# Patient Record
Sex: Male | Born: 1940 | Race: White | Hispanic: No | Marital: Married | Smoking: Former smoker
Health system: Southern US, Community
[De-identification: ages and names within clinical notes are randomized; demographics above are authoritative.]

## PROBLEM LIST (undated history)

## (undated) DIAGNOSIS — E876 Hypokalemia: Secondary | ICD-10-CM

## (undated) DIAGNOSIS — I10 Essential (primary) hypertension: Secondary | ICD-10-CM

## (undated) DIAGNOSIS — E785 Hyperlipidemia, unspecified: Secondary | ICD-10-CM

## (undated) DIAGNOSIS — F329 Major depressive disorder, single episode, unspecified: Secondary | ICD-10-CM

## (undated) HISTORY — DX: Essential (primary) hypertension: I10

## (undated) HISTORY — DX: Hypokalemia: E87.6

## (undated) HISTORY — DX: Major depressive disorder, single episode, unspecified: F32.9

## (undated) HISTORY — DX: Hyperlipidemia, unspecified: E78.5

---

## 2018-07-30 ENCOUNTER — Ambulatory Visit (INDEPENDENT_AMBULATORY_CARE_PROVIDER_SITE_OTHER): Payer: Self-pay

## 2018-07-30 ENCOUNTER — Ambulatory Visit (INDEPENDENT_AMBULATORY_CARE_PROVIDER_SITE_OTHER): Payer: Medicare Other | Admitting: Orthopaedic Surgery

## 2018-07-30 DIAGNOSIS — M65331 Trigger finger, right middle finger: Secondary | ICD-10-CM

## 2018-07-30 MED ORDER — BUPIVACAINE HCL 0.5 % IJ SOLN
0.3300 mL | INTRAMUSCULAR | Status: AC | PRN
  Administered 2018-07-30: .33 mL

## 2018-07-30 MED ORDER — LIDOCAINE HCL 1 % IJ SOLN
0.3000 mL | INTRAMUSCULAR | Status: AC | PRN
  Administered 2018-07-30: .3 mL

## 2018-07-30 MED ORDER — METHYLPREDNISOLONE ACETATE 40 MG/ML IJ SUSP
13.3300 mg | INTRAMUSCULAR | Status: AC | PRN
  Administered 2018-07-30: 13.33 mg

## 2018-07-30 NOTE — Progress Notes (Signed)
Office Visit Note   Patient: Gregory Perez           Date of Birth: 1941-02-05           MRN: [id number] Visit Date: 07/30/2018              Requested by: No referring provider defined for this encounter. PCP: Kerrie Pleasure, MD   Assessment & Plan: Visit Diagnoses:  1. Trigger middle finger of right hand     Plan: Impression is right middle trigger finger.  Injection performed today into the A1 pulley.  Patient had good anesthetic relief from this injection.  He is to take it easy with his right hand if possible.  Questions encouraged and answered.  Follow-up as needed.  Follow-Up Instructions: Return if symptoms worsen or fail to improve.   Orders:  No orders of the defined types were placed in this encounter.  No orders of the defined types were placed in this encounter.     Procedures: Hand/UE Inj: R long A1 for trigger finger on 07/30/2018 10:09 AM Indications: pain Details: 25 G needle Medications: 0.3 mL lidocaine 1 %; 0.33 mL bupivacaine 0.5 %; 13.33 mg methylPREDNISolone acetate 40 MG/ML Outcome: tolerated well, no immediate complications Consent was given by the patient. Patient was prepped and draped in the usual sterile fashion.       Clinical Data: No additional findings.   Subjective: Chief Complaint  Patient presents with  . Right Hand - Pain  . Right Wrist - Pain    Gregory Perez is a 78 year old gentleman who comes in for complaint of 3 months of right middle finger pain near the A1 pulley.  He states that this will lock up frequently.  He denies any injuries.  Denies any numbness and tingling.  Does feel stiff at times.  Denies diabetes.  He is right-hand dominant.   Review of Systems  Constitutional: Negative.   All other systems reviewed and are negative.    Objective: Vital Signs: There were no vitals taken for this visit.  Physical Exam Vitals signs and nursing note reviewed.  Constitutional:      Appearance: He is well-developed.    HENT:     Head: Normocephalic and atraumatic.  Eyes:     Pupils: Pupils are equal, round, and reactive to light.  Neck:     Musculoskeletal: Neck supple.  Pulmonary:     Effort: Pulmonary effort is normal.  Abdominal:     Palpations: Abdomen is soft.  Musculoskeletal: Normal range of motion.  Skin:    General: Skin is warm.  Neurological:     Mental Status: He is alert and oriented to person, place, and time.  Psychiatric:        Behavior: Behavior normal.        Thought Content: Thought content normal.        Judgment: Judgment normal.     Ortho Exam Right hand exam shows a tender A1 pulley in line with the middle finger.  There is mild triggering with moderate pain.  Otherwise hand exam is unremarkable. Specialty Comments:  No specialty comments available.  Imaging: No results found.   PMFS History: There are no active problems to display for this patient.  No past medical history on file.  No family history on file.   Social History   Occupational History  . Not on file  Tobacco Use  . Smoking status: Not on file  Substance and Sexual Activity  . Alcohol  use: Not on file  . Drug use: Not on file  . Sexual activity: Not on file

## 2019-01-05 ENCOUNTER — Encounter: Payer: Self-pay | Admitting: Orthopaedic Surgery

## 2019-01-05 ENCOUNTER — Ambulatory Visit (INDEPENDENT_AMBULATORY_CARE_PROVIDER_SITE_OTHER): Payer: Medicare Other

## 2019-01-05 ENCOUNTER — Other Ambulatory Visit: Payer: Self-pay

## 2019-01-05 ENCOUNTER — Ambulatory Visit (INDEPENDENT_AMBULATORY_CARE_PROVIDER_SITE_OTHER): Payer: Medicare Other | Admitting: Orthopaedic Surgery

## 2019-01-05 ENCOUNTER — Telehealth: Payer: Self-pay | Admitting: Physician Assistant

## 2019-01-05 DIAGNOSIS — M25512 Pain in left shoulder: Secondary | ICD-10-CM

## 2019-01-05 DIAGNOSIS — M542 Cervicalgia: Secondary | ICD-10-CM

## 2019-01-05 MED ORDER — METHYLPREDNISOLONE 4 MG PO TBPK: ORAL_TABLET | ORAL | 0 refills | Status: DC

## 2019-01-05 MED ORDER — METHOCARBAMOL 500 MG PO TABS: 500.0000 mg | ORAL_TABLET | Freq: Every evening | ORAL | 0 refills | Status: DC | PRN

## 2019-01-05 NOTE — Telephone Encounter (Signed)
Patient aware of the below message  

## 2019-01-05 NOTE — Progress Notes (Signed)
Office Visit Note   Patient: Gregory Perez           Date of Birth: Nov 01, 1940           MRN: [id number] Visit Date: 01/05/2019              Requested by: Henrietta Dine, MD [address], [place],  [place] [address] PCP: Henrietta Dine, MD   Assessment & Plan: Visit Diagnoses:  1. Cervicalgia   2. Left shoulder pain, unspecified chronicity     Plan: Impression is left-sided cervical spine radiculopathy.  I will start the patient on a methylprednisolone taper and muscle relaxer nightly as needed.  I will also start him in formal physical therapy.  He splits his time in [place] and [place] but will follow-up with [place] as needed.  We did discuss proceeding with MRI in the future should his pain not improve.  Follow-Up Instructions: Return if symptoms worsen or fail to improve.   Orders:  Orders Placed This Encounter  Procedures   XR Shoulder Left   XR Cervical Spine 2 or 3 views   Meds ordered this encounter  Medications   methylPREDNISolone (MEDROL DOSEPAK) 4 MG TBPK tablet    Sig: Take as directed    Dispense:  21 tablet    Refill:  0   methocarbamol (ROBAXIN) 500 MG tablet    Sig: Take 1 tablet (500 mg total) by mouth at bedtime as needed for muscle spasms.    Dispense:  15 tablet    Refill:  0      Procedures: No procedures performed   Clinical Data: No additional findings.   Subjective: Chief Complaint  Patient presents with   Left Shoulder - Pain    HPI patient is a pleasant 78 year old gentleman who presents our clinic today with left parascapular pain.  This has been ongoing for the past several months.  The pain he has is primarily to the parascapular region and occasionally radiates into the shoulder itself.  There are no specific aggravators to the onset of this pain.  He does note slight weakness to the left upper extremity.  He has been taking over-the-counter NSAIDs with mild relief of symptoms.  He does note occasional tingling to the small finger.   He does note that he has a massage therapist that comes to his house door from his shoulder, but has not been for the past several months due to the coated pandemic.  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  No previous MRI, ESI or surgical intervention.  Review of Systems as detailed in HPI.  All others reviewed and are negative.   Objective: Vital Signs: There were no vitals taken for this visit.  Physical Exam well-developed and well-nourished gentleman in no acute distress.  Alert oriented x3.  Ortho Exam examination of the cervical spine reveals no spinous or paraspinous tenderness.  He has moderate and diffuse tenderness to the left parascapular region.  Normal exam of the left shoulder.  No focal weakness.  He is neurovascular intact distally.  Specialty Comments:  No specialty comments available.  Imaging: Xr Cervical Spine 2 Or 3 Views  Result Date: 01/05/2019 Significant degenerative disc disease from C4-C7 with periarticular spurring  Xr Shoulder Left  Result Date: 01/05/2019 No acute or structural abnormalities    PMFS History: There are no active problems to display for this patient.  History reviewed. No  pertinent past medical history.  History reviewed. No pertinent family history.  History reviewed. No pertinent surgical history. Social History   Occupational History   Not on file  Tobacco Use   Smoking status: Not on file  Substance and Sexual Activity   Alcohol use: Not on file   Drug use: Not on file   Sexual activity: Not on file

## 2019-01-05 NOTE — Telephone Encounter (Signed)
See message.

## 2019-01-05 NOTE — Telephone Encounter (Signed)
Did you give him just a generic Rx for PT?

## 2019-01-05 NOTE — Telephone Encounter (Signed)
Yes, he goes back and forth between  [place] and [place], and was thinking he would find a place in [place].  I just looke up locations, and it looks like [facility] and [facility] have good reviews.  He can go anywhere he wishes

## 2019-01-05 NOTE — Telephone Encounter (Signed)
This patient came in this morning and was given a script for PT and wasn't sure where he would be going.  He wants someone to call him back to recommend a facility for PT  [phone]

## 2019-04-08 ENCOUNTER — Other Ambulatory Visit: Payer: Self-pay

## 2019-04-08 ENCOUNTER — Ambulatory Visit (INDEPENDENT_AMBULATORY_CARE_PROVIDER_SITE_OTHER): Payer: Medicare Other | Admitting: Orthopaedic Surgery

## 2019-04-08 DIAGNOSIS — M542 Cervicalgia: Secondary | ICD-10-CM

## 2019-04-08 DIAGNOSIS — M65331 Trigger finger, right middle finger: Secondary | ICD-10-CM | POA: Diagnosis not present

## 2019-04-08 DIAGNOSIS — M4807 Spinal stenosis, lumbosacral region: Secondary | ICD-10-CM

## 2019-04-08 MED ORDER — LIDOCAINE HCL 1 % IJ SOLN
0.5000 mL | INTRAMUSCULAR | Status: AC | PRN
  Administered 2019-04-08: .5 mL

## 2019-04-08 MED ORDER — METHYLPREDNISOLONE ACETATE 40 MG/ML IJ SUSP
20.0000 mg | INTRAMUSCULAR | Status: AC | PRN
  Administered 2019-04-08: 20 mg

## 2019-04-08 MED ORDER — DIAZEPAM 5 MG PO TABS: 5.0000 mg | ORAL_TABLET | Freq: Once | ORAL | 0 refills | Status: DC | PRN

## 2019-04-08 NOTE — Progress Notes (Signed)
Office Visit Note   Patient: Gregory Perez           Date of Birth: 02-May-1941           MRN: [id number] Visit Date: 04/08/2019              Requested by: Henrietta Dine, MD [address], [place],  [place] [address] PCP: Henrietta Dine, MD   Assessment & Plan: Visit Diagnoses:  1. Trigger middle finger of right hand   2. Cervicalgia     Plan: I did place a steroid injection in the right hand at the A1 pulley of the middle finger.  He tolerated this well.  He will try Voltaren gel for this as well and I described how to use this.  At this point an MRI of the cervical spine is warranted to assess the degree of stenosis given his continued radicular symptoms involving the left upper extremity as well as weakness combined with pain and numbness and tingling.  I will send in some Valium for the MRI due to claustrophobia.  We will see him back once the MRI is obtained.  All question concerns were answered and addressed.  Follow-Up Instructions: He will call for follow-up once an MRI is obtained of the cervical spine.  Orders:  No orders of the defined types were placed in this encounter.  No orders of the defined types were placed in this encounter.     Procedures: Hand/UE Inj: R long A1 for trigger finger on 04/08/2019 3:46 PM Medications: 0.5 mL lidocaine 1 %; 20 mg methylPREDNISolone acetate 40 MG/ML      Clinical Data: No additional findings.   Subjective: Chief Complaint  Patient presents with   Right Wrist - Pain   Right Hand - Pain   Neck - Pain   Left Shoulder - Pain  The patient comes in today with continued radicular symptoms into his left shoulder and left scapular area going down his left arm.  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  This was confirmed with x-rays earlier this year that showed significant disc disease at the mid cervical spine from C5-C7.  He is also having active triggering of his right hand middle finger  and has had an injection there before.  That did help for some time but he like to try another injection because of triggering again.  He says his neck hurts quite a bit.  He is very active 78 years old.  He is reporting some right upper extremity weakness and parascapular pain.  HPI  Review of Systems He currently denies any headache, chest pain, shortness of breath, fever, chills, nausea, vomiting  Objective: Vital Signs: There were no vitals taken for this visit.  Physical Exam He is alert and orient x3 and in no acute distress Ortho Exam Examination of his cervical spine shows significant stiffness with the limited motion.  There is a positive Spurling sign to the left side.  There is parascapular pain and some triceps weakness.  There is subjective numbness in C6 distribution as well on the left side.  His right side is normal.  Examination of his right hand does show pain over the A1 pulley of the middle finger with active triggering. Specialty Comments:  No specialty comments available.  Imaging: No results found. We did review the cervical spine films from earlier this year showing the degree of significant and severe mid cervical arthritis with the space narrowing  in particular osteophytes as well as a loss of the cervical lordosis.  PMFS History: There are no active problems to display for this patient.  No past medical history on file.  No family history on file.  No past surgical history on file. Social History   Occupational History   Not on file  Tobacco Use   Smoking status: Not on file  Substance and Sexual Activity   Alcohol use: Not on file   Drug use: Not on file   Sexual activity: Not on file

## 2019-04-14 ENCOUNTER — Ambulatory Visit
Admission: RE | Admit: 2019-04-14 | Discharge: 2019-04-14 | Disposition: A | Discharge status: Home / Self Care | Payer: Medicare Other | Source: Ambulatory Visit | Attending: Orthopaedic Surgery | Admitting: Orthopaedic Surgery

## 2019-04-14 ENCOUNTER — Other Ambulatory Visit: Payer: Self-pay | Admitting: Orthopaedic Surgery

## 2019-04-14 DIAGNOSIS — M4807 Spinal stenosis, lumbosacral region: Secondary | ICD-10-CM

## 2019-04-14 DIAGNOSIS — M79602 Pain in left arm: Secondary | ICD-10-CM

## 2019-04-14 DIAGNOSIS — R29898 Other symptoms and signs involving the musculoskeletal system: Secondary | ICD-10-CM

## 2019-04-20 ENCOUNTER — Other Ambulatory Visit: Payer: Self-pay

## 2019-04-20 ENCOUNTER — Ambulatory Visit (INDEPENDENT_AMBULATORY_CARE_PROVIDER_SITE_OTHER): Payer: Medicare Other | Admitting: Orthopaedic Surgery

## 2019-04-20 ENCOUNTER — Encounter: Payer: Self-pay | Admitting: Orthopaedic Surgery

## 2019-04-20 DIAGNOSIS — M542 Cervicalgia: Secondary | ICD-10-CM | POA: Diagnosis not present

## 2019-04-20 DIAGNOSIS — M5010 Cervical disc disorder with radiculopathy, unspecified cervical region: Secondary | ICD-10-CM | POA: Diagnosis not present

## 2019-04-20 NOTE — Progress Notes (Signed)
The patient is here today to go over a MRI of his cervical spine.  His plain films showed significant arthritis in the mid cervical spine and he was having radicular symptoms in the left shoulder parascapular area and rating down his left arm.  On examination he still has parascapular pain on the left side.  There is only slight weakness in the triceps and there is subjective numbness in the C6 distribution on the left side.  I did not sense that there was a trigger point pain in the trapezius area on exam.  He does have a positive Spurling sign to the left side as well.  He has intact grip strength bilaterally.  MRI is reviewed with him and it does show moderate foraminal stenosis at several levels of the cervical spine especially C5-C6 which seems to be the most significant area of stenosis.  I actually went over the MRI with him as far as the report goes and we looked at the study itself.  He would like to consider an intervention in his neck in terms of a steroid injection.  I will send him to Dr. Ernestina Patches for a left-sided likely C6-C7 injection that could hopefully help with his symptoms.  He is in agreement that he would like to try an injection before any type of referral for possible surgical intervention.  All question concerns were answered and addressed.  Once he has an injection with Dr. Ernestina Patches Dr. Ernestina Patches can schedule him back to see me.  The patient does come down here from doing [place] and has family that lives in our neighborhood

## 2019-05-11 ENCOUNTER — Other Ambulatory Visit: Payer: Self-pay

## 2019-05-11 ENCOUNTER — Ambulatory Visit (INDEPENDENT_AMBULATORY_CARE_PROVIDER_SITE_OTHER): Payer: Medicare Other | Admitting: Physical Medicine and Rehabilitation

## 2019-05-11 ENCOUNTER — Encounter: Payer: Self-pay | Admitting: Physical Medicine and Rehabilitation

## 2019-05-11 ENCOUNTER — Ambulatory Visit: Payer: Self-pay

## 2019-05-11 VITALS — BP 156/100 | HR 73

## 2019-05-11 DIAGNOSIS — M5412 Radiculopathy, cervical region: Secondary | ICD-10-CM | POA: Diagnosis not present

## 2019-05-11 MED ORDER — METHYLPREDNISOLONE ACETATE 80 MG/ML IJ SUSP
80.0000 mg | Freq: Once | INTRAMUSCULAR | Status: AC
  Administered 2019-05-11: 80 mg

## 2019-05-11 NOTE — Progress Notes (Signed)
 .  Numeric Pain Rating Scale and Functional Assessment Average Pain 8   In the last MONTH (on 0-10 scale) has pain interfered with the following?  1. General activity like being  able to carry out your everyday physical activities such as walking, climbing stairs, carrying groceries, or moving a chair?  Rating(8)   +Driver, -BT, -Dye Allergies.  

## 2019-06-01 ENCOUNTER — Telehealth: Payer: Self-pay | Admitting: Physical Medicine and Rehabilitation

## 2019-06-01 NOTE — Procedures (Signed)
Cervical Epidural Steroid Injection - Interlaminar Approach with Fluoroscopic Guidance  Patient: Gregory Perez      Date of Birth: 09-Dec-1940 MRN: [id number] PCP: Henrietta Dine, MD      Visit Date: 05/11/2019   Universal Protocol:    Date/Time: 11/24/208:25 AM  Consent Given By: the patient  Position: PRONE  Additional Comments: Vital signs were monitored before and after the procedure. Patient was prepped and draped in the usual sterile fashion. The correct patient, procedure, and site was verified.   Injection Procedure Details:  Procedure Site One Meds Administered:  Meds ordered this encounter  Medications  . methylPREDNISolone acetate (DEPO-MEDROL) injection 80 mg     Laterality: Left  Location/Site: C7-T1  Needle size: 20 G  Needle type: Touhy  Needle Placement: Paramedian epidural space  Findings:  -Comments: Excellent flow of contrast into the epidural space.  Procedure Details: Using a paramedian approach from the side mentioned above, the region overlying the inferior lamina was localized under fluoroscopic visualization and the soft tissues overlying this structure were infiltrated with 4 ml. of 1% Lidocaine without Epinephrine. A # 20 gauge, Tuohy needle was inserted into the epidural space using a paramedian approach.  The epidural space was localized using loss of resistance along with lateral and contralateral oblique bi-planar fluoroscopic views.  After negative aspirate for air, blood, and CSF, a 2 ml. volume of Isovue-250 was injected into the epidural space and the flow of contrast was observed. Radiographs were obtained for documentation purposes.   The injectate was administered into the level noted above.  Additional Comments:  The patient tolerated the procedure well Dressing: 2 x 2 sterile gauze and Band-Aid    Post-procedure details: Patient was observed during the procedure. Post-procedure instructions were reviewed.  Patient left  the clinic in stable condition.

## 2019-06-01 NOTE — Telephone Encounter (Signed)
Without much relief I would hold off for now and just see how last.  He notices it starting to come back any times a day and then we will repeat the injection.

## 2019-06-01 NOTE — Progress Notes (Signed)
Gregory Perez - 78 y.o. male MRN [id number]  Date of birth: 1940-11-29  Office Visit Note: Visit Date: 05/11/2019 PCP: Kerrie Pleasure, MD Referred by: Kerrie Pleasure, MD  Subjective: Chief Complaint  Patient presents with  . Neck - Pain  . Left Shoulder - Pain   HPI: Gregory Perez is a 78 y.o. male who comes in today At the request of Dr. Doneen Poisson for left C7-T1 interlaminar epidural steroid injection.  Patient said left neck and shoulder pain not resolved with normal conservative care.  MRI reviewed with the patient showing multilevel spondylosis with some foraminal narrowing left paracentral disc protrusion at C7.  We will complete injection today and see how he does.  ROS Otherwise per HPI.  Assessment & Plan: Visit Diagnoses:  1. Cervical radiculopathy     Plan: No additional findings.   Meds & Orders:  Meds ordered this encounter  Medications  . methylPREDNISolone acetate (DEPO-MEDROL) injection 80 mg    Orders Placed This Encounter  Procedures  . XR C-ARM NO REPORT  . Epidural Steroid injection    Follow-up: Return if symptoms worsen or fail to improve.   Procedures: No procedures performed  Cervical Epidural Steroid Injection - Interlaminar Approach with Fluoroscopic Guidance  Patient: Gregory Perez      Date of Birth: 09/27/1940 MRN: [id number] PCP: Kerrie Pleasure, MD      Visit Date: 05/11/2019   Universal Protocol:    Date/Time: 11/24/208:25 AM  Consent Given By: the patient  Position: PRONE  Additional Comments: Vital signs were monitored before and after the procedure. Patient was prepped and draped in the usual sterile fashion. The correct patient, procedure, and site was verified.   Injection Procedure Details:  Procedure Site One Meds Administered:  Meds ordered this encounter  Medications  . methylPREDNISolone acetate (DEPO-MEDROL) injection 80 mg     Laterality: Left  Location/Site: C7-T1  Needle size: 20 G   Needle type: Touhy  Needle Placement: Paramedian epidural space  Findings:  -Comments: Excellent flow of contrast into the epidural space.  Procedure Details: Using a paramedian approach from the side mentioned above, the region overlying the inferior lamina was localized under fluoroscopic visualization and the soft tissues overlying this structure were infiltrated with 4 ml. of 1% Lidocaine without Epinephrine. A # 20 gauge, Tuohy needle was inserted into the epidural space using a paramedian approach.  The epidural space was localized using loss of resistance along with lateral and contralateral oblique bi-planar fluoroscopic views.  After negative aspirate for air, blood, and CSF, a 2 ml. volume of Isovue-250 was injected into the epidural space and the flow of contrast was observed. Radiographs were obtained for documentation purposes.   The injectate was administered into the level noted above.  Additional Comments:  The patient tolerated the procedure well Dressing: 2 x 2 sterile gauze and Band-Aid    Post-procedure details: Patient was observed during the procedure. Post-procedure instructions were reviewed.  Patient left the clinic in stable condition.    Clinical History: MRI CERVICAL SPINE WITHOUT CONTRAST  TECHNIQUE: Multiplanar, multisequence MR imaging of the cervical spine was performed. No intravenous contrast was administered.  COMPARISON:  Cervical spine radiographs 01/05/2019  FINDINGS: Alignment: Mild anterolisthesis at C3-4. Straightening of the cervical lordosis.  Vertebrae: Negative for fracture or mass.  Cord: Normal cord signal.  No cord compression or cord lesion.  Posterior Fossa, vertebral arteries, paraspinal tissues: Negative for paraspinous mass.  Patchy hyperintensity in the pons likely due  to chronic microvascular ischemia.  Disc levels:  C2-3: Small central disc protrusion. Mild facet degeneration on the left. Mild left  foraminal narrowing due to spurring.  C3-4: Mild anterolisthesis. Disc and facet degeneration. Moderate left foraminal narrowing due to spurring.  C4-5: Disc degeneration and diffuse uncinate spurring. Cord flattening with mild spinal stenosis. Mild foraminal narrowing bilaterally due to spurring  C5-6: Disc degeneration and spondylosis. Cord flattening with mild to moderate spinal stenosis. Moderate foraminal narrowing bilaterally due to spurring, more severe on the left than the right  C6-7: Moderate left foraminal narrowing due to spurring. Cord flattening and mild spinal stenosis  C7-T1: Small central disc protrusion. Disc degeneration and spurring causing mild foraminal narrowing bilaterally, left greater than right.  IMPRESSION: Multilevel cervical spondylosis. Multilevel spinal and foraminal encroachment throughout the cervical spine as described above.  Patchy hyperintensity in the pons bilaterally most likely chronic microvascular ischemic change.   Electronically Signed   By: Franchot Gallo M.D.   On: 04/15/2019 09:04   He has no history on file for tobacco. No results for input(s): HGBA1C, LABURIC in the last 8760 hours.  Objective:  VS:  HT:    WT:   BMI:     BP:(!) 156/100  HR:73bpm  TEMP: ( )  RESP:  Physical Exam  Ortho Exam Imaging: No results found.  Past Medical/Family/Surgical/Social History: Medications & Allergies reviewed per EMR, new medications updated. There are no active problems to display for this patient.  History reviewed. No pertinent past medical history. History reviewed. No pertinent family history. History reviewed. No pertinent surgical history. Social History   Occupational History  . Not on file  Tobacco Use  . Smoking status: Not on file  Substance and Sexual Activity  . Alcohol use: Not on file  . Drug use: Not on file  . Sexual activity: Not on file

## 2019-07-13 ENCOUNTER — Encounter: Payer: Self-pay | Admitting: Internal Medicine

## 2019-07-20 ENCOUNTER — Ambulatory Visit: Payer: Medicare Other | Attending: Internal Medicine

## 2019-07-20 DIAGNOSIS — Z23 Encounter for immunization: Secondary | ICD-10-CM | POA: Insufficient documentation

## 2019-07-20 NOTE — Progress Notes (Signed)
   Covid-19 Vaccination Clinic  Name:  Gregory Perez    MRN: [id number] DOB: 1941-03-03  07/20/2019  Gregory Perez was observed post Covid-19 immunization for 15 minutes without incidence. He was provided with Vaccine Information Sheet and instruction to access the V-Safe system.   Gregory Perez was instructed to call 911 with any severe reactions post vaccine: Marland Kitchen Difficulty breathing  . Swelling of your face and throat  . A fast heartbeat  . A bad rash all over your body  . Dizziness and weakness    Immunizations Administered    Name Date Dose VIS Date Route   Pfizer COVID-19 Vaccine 07/20/2019 11:25 AM 0.3 mL 06/18/2019 Intramuscular   Manufacturer: [employer], [employer]   Lot: [id number]   NDC: [address]-6

## 2019-08-05 ENCOUNTER — Ambulatory Visit: Payer: Medicare Other | Admitting: Neurology

## 2019-08-09 ENCOUNTER — Ambulatory Visit: Payer: Medicare Other | Attending: Internal Medicine

## 2019-08-09 DIAGNOSIS — Z23 Encounter for immunization: Secondary | ICD-10-CM

## 2019-08-09 NOTE — Progress Notes (Signed)
   Covid-19 Vaccination Clinic  Name:  Gregory Perez    MRN: [id number] DOB: 04/14/1941  08/09/2019  Mr. [name] was observed post Covid-19 immunization for 15 minutes without incidence. He was provided with Vaccine Information Sheet and instruction to access the V-Safe system.   Mr. [name] was instructed to call 911 with any severe reactions post vaccine: Marland Kitchen Difficulty breathing  . Swelling of your face and throat  . A fast heartbeat  . A bad rash all over your body  . Dizziness and weakness    Immunizations Administered    Name Date Dose VIS Date Route   Pfizer COVID-19 Vaccine 08/09/2019 10:48 AM 0.3 mL 06/18/2019 Intramuscular   Manufacturer: [employer], [employer]   Lot: [id number]   NDC: [address]-9

## 2019-11-10 ENCOUNTER — Other Ambulatory Visit: Payer: Self-pay | Admitting: Neurology

## 2019-11-10 ENCOUNTER — Encounter: Payer: Self-pay | Admitting: Neurology

## 2019-11-11 ENCOUNTER — Ambulatory Visit (INDEPENDENT_AMBULATORY_CARE_PROVIDER_SITE_OTHER): Payer: Medicare Other | Admitting: Neurology

## 2019-11-11 ENCOUNTER — Encounter: Payer: Self-pay | Admitting: Neurology

## 2019-11-11 ENCOUNTER — Other Ambulatory Visit: Payer: Self-pay

## 2019-11-11 VITALS — BP 136/78 | HR 71 | Temp 97.4°F | Ht 69.0 in | Wt 192.0 lb

## 2019-11-11 DIAGNOSIS — F518 Other sleep disorders not due to a substance or known physiological condition: Secondary | ICD-10-CM

## 2019-11-11 DIAGNOSIS — G4752 REM sleep behavior disorder: Secondary | ICD-10-CM | POA: Diagnosis not present
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
## 2019-11-11 NOTE — Patient Instructions (Signed)

## 2019-11-14 ENCOUNTER — Encounter: Payer: Self-pay | Admitting: Neurology

## 2019-11-22 ENCOUNTER — Telehealth: Payer: Self-pay

## 2019-11-22 NOTE — Telephone Encounter (Signed)
LVM for pt to call me back to schedule sleep study  

## 2019-11-26 ENCOUNTER — Encounter: Payer: Self-pay | Admitting: Neurology

## 2019-12-06 ENCOUNTER — Ambulatory Visit (INDEPENDENT_AMBULATORY_CARE_PROVIDER_SITE_OTHER): Payer: Medicare Other | Admitting: Neurology

## 2019-12-06 DIAGNOSIS — G4733 Obstructive sleep apnea (adult) (pediatric): Secondary | ICD-10-CM | POA: Diagnosis not present

## 2019-12-06 DIAGNOSIS — F518 Other sleep disorders not due to a substance or known physiological condition: Secondary | ICD-10-CM

## 2019-12-06 DIAGNOSIS — G4752 REM sleep behavior disorder: Secondary | ICD-10-CM

## 2019-12-07 ENCOUNTER — Encounter: Payer: Self-pay | Admitting: Neurology

## 2019-12-16 DIAGNOSIS — G4752 REM sleep behavior disorder: Secondary | ICD-10-CM | POA: Insufficient documentation

## 2019-12-16 DIAGNOSIS — F518 Other sleep disorders not due to a substance or known physiological condition: Secondary | ICD-10-CM | POA: Insufficient documentation

## 2019-12-16 NOTE — Addendum Note (Signed)
Addended by: Melvyn Novas on: 12/16/2019 07:01 PM   Modules accepted: Orders
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
## 2019-12-17 NOTE — Telephone Encounter (Signed)
===  View-only below this line===   ----- Message -----      From:Danie Coppa      Sent:12/17/2019 12:57 PM EDT        MB:PJPETK Fraya Ueda, MD   Subject:RE: Non-Urgent Medical Question  Dr.  I saw the results and much of it will take your help to understand.  Most important is next step.  Reference Made to another study. Not something I want to do   Answer: Hi, Mr. Mogan  The dilemma is the significant 02 desaturation for long time.  Mild apnea is not the main problem but it offers me a way to treat you-  We would try to correct the apnea/ hypopnea with PAP ( positive airway pressure) and once we reach zero apneas we can apply oxygen if still needed. In order to prescribe any oxygen , I do need to document and show that CPAP/BiPAP alone was not effective in correcting the hypoxemia and the response to oxygen has to be documented as well- That can't be done in a home test. I will ask for Beau to be your tech.   Enjoy the week's end ! CD  Cc Dr [name], DO

## 2019-12-17 NOTE — Telephone Encounter (Signed)
IMPRESSION:  1. This is Obstructive Sleep Apnea (OSA) manifested as sleep hypopnea at an AHI of 8.8/h with exacerbation in REM sleep (AHI of 25.1/h) and in supine sleep (14.6/h) . 2. Severe Sleep Hypoxia - oxygen drops rapidly and reached critical low nadir 76% repeatedly, also overall hypoxia duration of 173 minutes was severe.  3. 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 We were not able to capture a REM BD episode.  4. Snoring was present    RECOMMENDATIONS: The patient has clearly a sleep disorder aside from REM BD. There is mild apnea but severe sleep hypoxia with a critical nadir, and prolonged total desaturation time. This apnea cannot be addressed by dental device or Inspire technology, it will require Positive Airway Therapy.    I will ask you to come in for a PAP titration, allowing me to add oxygen if needed. Hyour hypoxemia at night was significant , the apnea was mild. The REM behavior diagnosis was supported by   EMG discharges during REM sleep- elevated muscle tone in REM sleep. There are many people who will have much more restful sleep once the apnea/ hypoxia treatment is in place, and this can reduce the number of episodic dream enactment, too.   Sincerely, Melvyn Novas, MD

## 2019-12-22 ENCOUNTER — Encounter: Payer: Self-pay | Admitting: Neurology

## 2020-01-04 ENCOUNTER — Ambulatory Visit (INDEPENDENT_AMBULATORY_CARE_PROVIDER_SITE_OTHER): Payer: Medicare Other | Admitting: Neurology

## 2020-01-04 DIAGNOSIS — F518 Other sleep disorders not due to a substance or known physiological condition: Secondary | ICD-10-CM

## 2020-01-04 DIAGNOSIS — G4733 Obstructive sleep apnea (adult) (pediatric): Secondary | ICD-10-CM

## 2020-01-04 DIAGNOSIS — G4752 REM sleep behavior disorder: Secondary | ICD-10-CM

## 2020-01-06 ENCOUNTER — Other Ambulatory Visit: Payer: Self-pay

## 2020-01-06 ENCOUNTER — Ambulatory Visit (INDEPENDENT_AMBULATORY_CARE_PROVIDER_SITE_OTHER): Payer: Medicare Other

## 2020-01-06 ENCOUNTER — Ambulatory Visit (INDEPENDENT_AMBULATORY_CARE_PROVIDER_SITE_OTHER): Payer: Medicare Other | Admitting: Orthopaedic Surgery

## 2020-01-06 DIAGNOSIS — M25561 Pain in right knee: Secondary | ICD-10-CM

## 2020-01-06 DIAGNOSIS — G8929 Other chronic pain: Secondary | ICD-10-CM

## 2020-01-06 MED ORDER — LIDOCAINE HCL 1 % IJ SOLN
3.0000 mL | INTRAMUSCULAR | Status: AC | PRN
  Administered 2020-01-06: 3 mL

## 2020-01-06 MED ORDER — METHYLPREDNISOLONE ACETATE 40 MG/ML IJ SUSP
40.0000 mg | INTRAMUSCULAR | Status: AC | PRN
  Administered 2020-01-06: 40 mg via INTRA_ARTICULAR

## 2020-01-06 NOTE — Progress Notes (Signed)
Office Visit Note   Patient: Gregory Perez           Date of Birth: December 12, 1940           MRN: [id number] Visit Date: 01/06/2020              Requested by: [name], DO [address] [place],  [place] [address] PCP: [name], DO   Assessment & Plan: Visit Diagnoses:  1. 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  There is no significant arthritic findings at all.  I have recommended quad strengthening exercises and trying at least a one-time steroid injection.  I explained the risk and benefits of steroid injections and he agrees with this treatment plan.  I placed a steroid in his right knee without difficulty.  All questions and concerns were answered addressed.  Follow-up can be as needed.  If his symptoms persist with in terms of locking and catching and pivoting activities causing pain I would recommend a MRI of the right knee to rule out a meniscal tear.  He will let us know if that is the case.  Follow-Up Instructions: Return if symptoms worsen or fail to improve.   Orders:  Orders Placed This Encounter  Procedures   Large Joint Inj   XR Knee 1-2 Views Right   No orders of the defined types were placed in this encounter.     Procedures: Large Joint Inj: R knee on 01/06/2020 3:34 PM Indications: diagnostic evaluation and pain Details: 22 G 1.5 in needle, superolateral approach  Arthrogram: No  Medications: 3 mL lidocaine 1 %; 40 mg methylPREDNISolone acetate 40 MG/ML Outcome: tolerated well, no immediate complications Procedure, treatment alternatives, risks and benefits explained, specific risks discussed. Consent was given by the patient. Immediately prior to procedure a time out was called to verify the correct patient, procedure, equipment, support staff and site/side marked as required. Patient was prepped and draped in the usual  sterile fashion.       Clinical Data: No additional findings.   Subjective: Chief Complaint  Patient presents with   Right Knee - Pain  Gregory Perez is well-known to me.  I know him personally as well.  He is coming in with right knee pain is been hurting for many months.  He points the medial joint line.  He does a lot of driving from [facility] down to [place] to see family.  He has been trying to use some bands which are not helpful to try to get his knee strong.  He does want to be able to exercise but is hurting his knee.  He does use his treadmill quite a bit.  His knee has been stiff and sore with the right knee.  He denies any locking catching.  He is never had surgery on the right knee.  He has had no other acute changes in medical status.  HPI  Review of Systems He currently denies any headache, chest pain, shortness of breath, fever, chills, nausea, vomiting  Objective: Vital Signs: There were no vitals taken for this visit.  Physical Exam He is alert and orient x3 and in no acute distress Ortho Exam Examination of his right knee shows a positive Murray sign to the medial compartment.  His range of motion is full in the knee is ligamentously stable.  There is no effusion.  He does have medial joint line tenderness. Specialty Comments:  No specialty comments available.  Imaging: XR Knee 1-2 Views Right  Result Date: 01/06/2020 2 views of the right knee show well-maintained joint space with no acute findings.  The alignment is overall neutral.    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  No past surgical history on file. Social History   Occupational History   Not on file  Tobacco Use   Smoking  status: Former Smoker    Quit date: 1980    Years since quitting: 41.5   Smokeless tobacco: Never Used  Substance and Sexual Activity   Alcohol use: Not Currently   Drug use: Not Currently   Sexual activity: Not on file

## 2020-01-10 DIAGNOSIS — G4734 Idiopathic sleep related nonobstructive alveolar hypoventilation: Secondary | ICD-10-CM | POA: Insufficient documentation

## 2020-01-10 DIAGNOSIS — G4733 Obstructive sleep apnea (adult) (pediatric): Secondary | ICD-10-CM | POA: Insufficient documentation

## 2020-01-10 NOTE — Addendum Note (Signed)
Addended by: Melvyn Novas on: 01/10/2020 12:23 PM   Modules accepted: Orders

## 2020-01-10 NOTE — Procedures (Signed)
PATIENT'S NAME:  Gregory Perez, Gregory Perez DOB:      [birth year]      MR#:    [id number]     DATE OF RECORDING: 01/04/2020 REFERRING M.D.:  [name], DO Study Performed:   Titration to positive airway pressure  HISTORY:  This established Sleep apnea patient  returned for a CPAP titration following an expanded EEG PSG performed on 12/06/19, which resulted in mild apnea with AHI of 8.8/h, REM AHI of 25.1/h (!) and severe sleep hypoxia for a total duration of 173 minutes during sleep, Nadir spo2 of 76%. Also noted were PLMs.  This study was meant to address the hypoxia via treatment of apnea by PAP therapy and, if needed, use of additional Oxygen.  The patient endorsed the Epworth Sleepiness Scale at 2 points.   The patient's weight 192 pounds with a height of 69 (inches), resulting in a BMI of 28.4 kg/m2. The patient's neck circumference measured 16.25 inches.  CURRENT MEDICATIONS: Norvasc, Aspirin, Lipitor, Wellbutrin, Hygroton, Proscar, Prozac, Claritin, Cozaar   PROCEDURE:  This is a multichannel digital polysomnogram utilizing the SomnoStar 11.2 system.  Electrodes and sensors were applied and monitored per AASM Specifications.   EEG, EOG, Chin and Limb EMG, were sampled at 200 Hz.  ECG, Snore and Nasal Pressure, Thermal Airflow, Respiratory Effort, CPAP Flow and Pressure, Oximetry was sampled at 50 Hz. Digital video and audio were recorded.      CPAP was initiated under a medium size nasal cradle N30 mask, a ResMed AirFit model, at 5 cmH20 with heated humidity per AASM split night standards and pressure was advanced to 6 cmH20 because of hypopneas, apneas and desaturations. Irregular breathing, hypopneic and central events were soon noted and change to BiPAP performed. Bilevel started at 9/5cm water, and increased to 12/8 cm water BiPAP for some hypopneas remaining.   At a PAP pressure of 12/8 cmH20, there was a reduction of the AHI to 2.9/h with improvement of sleep apnea and oxygen saturation. The best  tolerated pressure was at 11/7 cm water.   Lights Out was at 21:59 and Lights On at 05:00. Total recording time (TRT) was 421.5 minutes, with a total sleep time (TST) of 330 minutes. The patient's sleep latency was 49 minutes. REM latency was 162.5 minutes.  The sleep efficiency was 78.3 %.    SLEEP ARCHITECTURE: WASO (Wake after sleep onset) was 66 minutes.  There were 56.5 minutes in Stage N1, 85 minutes Stage N2, 149.5 minutes Stage N3 and 39 minutes in Stage REM.  The percentage of Stage N1 was 17.1%, Stage N2 was 25.8%, Stage N3 was 45.3% and Stage R (REM sleep) was 11.8%.    RESPIRATORY ANALYSIS:  There was a total of 34 respiratory events: 0 obstructive apneas, 0 central apneas and 1 mixed apnea with 33 central hypopneas.     The total APNEA/HYPOPNEA INDEX (AHI) was 6.2 /hour.  6 events occurred in REM sleep and 28 events in NREM. The REM AHI was 9.2 /hour versus a non-REM AHI of 5.8 /hour.  The patient spent 330 minutes of total sleep time in the supine position and 0 minutes in non-supine.    OXYGEN SATURATION & C02:  The baseline 02 saturation was 90%, with the lowest being 81%. Time spent below 89% saturation equaled 21 minutes. The arousals were noted as: 93 were spontaneous, 2 were associated with PLMs, and only 9 were associated with respiratory events. The patient had a total of 12 Periodic Limb Movements. The Periodic Limb  Movement (PLM) Arousal index was 0.4 /hour. Audio and video analysis did not show any abnormal or unusual movements, behaviors, phonations or vocalizations.  The patient took two bathroom breaks. EKG was in keeping with regular rhythm.  DIAGNOSIS 1. Cyclic Sleep Apnea with hypoxemia responded best to BiPAP pressure of 11/7 cm water  2. 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  3. Sleep Related Hypoxemia improved and oxygen supplementation was not needed.    PLANS/RECOMMENDATIONS: BiPAP auto will be provided at a pressure split of 4 cm water, covering the  pressure range from 9/5 through 14/10 cm water. The patient was fitted with a ResMed AirFit N30 medium mask.  1. Any apnea patient should avoid use of sedatives, hypnotics, and excessive alcohol consumption before bedtime.  2. CPAP therapy compliance is defined as 4 hours of nightly use.   DISCUSSION: A follow up appointment will be scheduled in the Sleep Clinic at [facility].   Please call [phone] with any questions.     I certify that I have reviewed the entire raw data recording prior to the issuance of this report in accordance with the Standards of Accreditation of the American Academy of Sleep Medicine (AASM)   Melvyn Novas, M.D. Diplomat, Biomedical engineer of Psychiatry and Neurology  Diplomat, Biomedical engineer of Sleep Medicine Wellsite geologist, [employer] [facility] [employer]

## 2020-01-11 ENCOUNTER — Encounter: Payer: Self-pay | Admitting: Orthopaedic Surgery

## 2020-01-12 ENCOUNTER — Telehealth: Payer: Self-pay | Admitting: Neurology

## 2020-01-12 NOTE — Telephone Encounter (Signed)
I called pt. I advised pt that Dr. Vickey Huger reviewed their sleep study results and found that pt was best tolerated under BiPAP. Dr. Vickey Huger recommends that pt starts Auto BiPAP. I reviewed PAP compliance expectations with the pt. Pt is agreeable to starting a CPAP. I advised pt that an order will be sent to a DME, [facility] and [facility] will call the pt within about one week after they file with the pt's insurance. [facility]) will show the pt how to use the machine, fit for masks, and troubleshoot the CPAP if needed. A follow up appt was made for insurance purposes with Dr. Vickey Huger on Oct 6,2021. Pt verbalized understanding to arrive 15 minutes early and bring their CPAP. A letter with all of this information in it will be mailed to the pt as a reminder. I verified with the pt that the address we have on file is correct. Pt verbalized understanding of results. Pt had no questions at this time but was encouraged to call back if questions arise. I have sent the order to [facility] and have received confirmation that they have received the order.

## 2020-01-12 NOTE — Telephone Encounter (Signed)
-----   Message from Melvyn Novas, MD sent at 01/10/2020 12:23 PM EDT ----- DIAGNOSIS  1. Cyclic Sleep Apnea with hypoxemia responded best to BiPAP  pressure of 11/7 cm water  2. 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  3. Sleep Related Hypoxemia improved and oxygen supplementation  was not needed.    PLANS/RECOMMENDATIONS: BiPAP auto-device will be provided at a pressure  split of 4 cm water, covering the pressure range from 9/5 through  14/10 cm water.  The patient was fitted with a ResMed AirFit N30 medium mask.   1. Any apnea patient should avoid use of sedatives, hypnotics,  and excessive alcohol consumption before bedtime.  2. BiPAP / CPAP therapy compliance is defined as 4 hours of nightly use.

## 2020-01-19 ENCOUNTER — Encounter: Payer: Self-pay | Admitting: Neurology

## 2020-02-10 ENCOUNTER — Ambulatory Visit: Payer: Medicare Other | Admitting: Neurology

## 2020-03-19 ENCOUNTER — Encounter: Payer: Self-pay | Admitting: Neurology

## 2020-03-19 NOTE — Telephone Encounter (Signed)
Cluster headaches usually are severe stabbing headaches that wake you out of sleep, never long lasting, more short attacks- sometimes several  in one night ( cluster)  and one can benefit from some migraine treatments, those that are also preventives for clusters: calcium channel blocker, betablocker, anti-seizure medication. For most people, the acute pain responds to triptan- however we must pay special attention to your cardiac history and will not treat with vasoconstricting meds( "Triptans" such as imitrex)- if there is coronary disease present.  What you describe is a facial headache- will it make your eyes tear? Your face flush? That may be trigeminal neuralgia and is treated long term with anti-seizure medication and acutely with steroids. Usually, for this condition, the lower or midface of one side only is involved.  Sinus headaches get worse when you bend forward, sneeze or cough- pressure transmitted. Is that the case? Then, ENT is right for you.

## 2020-03-21 ENCOUNTER — Ambulatory Visit
Admission: RE | Admit: 2020-03-21 | Discharge: 2020-03-21 | Disposition: A | Discharge status: Home / Self Care | Payer: Medicare Other | Source: Ambulatory Visit | Attending: Internal Medicine | Admitting: Internal Medicine

## 2020-03-21 ENCOUNTER — Other Ambulatory Visit: Payer: Self-pay | Admitting: Internal Medicine

## 2020-03-21 DIAGNOSIS — R519 Headache, unspecified: Secondary | ICD-10-CM

## 2020-03-21 DIAGNOSIS — L089 Local infection of the skin and subcutaneous tissue, unspecified: Secondary | ICD-10-CM

## 2020-03-22 ENCOUNTER — Encounter: Payer: Self-pay | Admitting: Neurology

## 2020-04-06 ENCOUNTER — Other Ambulatory Visit: Payer: Self-pay

## 2020-04-06 ENCOUNTER — Encounter: Payer: Self-pay | Admitting: Orthopaedic Surgery

## 2020-04-06 ENCOUNTER — Ambulatory Visit (INDEPENDENT_AMBULATORY_CARE_PROVIDER_SITE_OTHER): Payer: Medicare Other | Admitting: Orthopaedic Surgery

## 2020-04-06 DIAGNOSIS — M65332 Trigger finger, left middle finger: Secondary | ICD-10-CM | POA: Diagnosis not present

## 2020-04-06 MED ORDER — LIDOCAINE HCL 1 % IJ SOLN
0.5000 mL | INTRAMUSCULAR | Status: AC | PRN
  Administered 2020-04-06: .5 mL

## 2020-04-06 MED ORDER — METHYLPREDNISOLONE ACETATE 40 MG/ML IJ SUSP
20.0000 mg | INTRAMUSCULAR | Status: AC | PRN
  Administered 2020-04-06: 20 mg

## 2020-04-06 NOTE — Progress Notes (Signed)
   Office Visit Note   Patient: Gregory Perez           Date of Birth: 1940/09/15           MRN: [id number] Visit Date: 04/06/2020              Requested by: [name], DO [address] [place],  [place] [address] PCP: [name], DO   Assessment & Plan: Visit Diagnoses:  1. Trigger finger, left middle finger     Plan: I do feel its appropriate trying a steroid injection today given his good response in his right hand.  We did place this over the A1 pulley of the left middle finger and he tolerated well.  All question concerns were answered and addressed.  Follow-up can be as needed.  Follow-Up Instructions: Return if symptoms worsen or fail to improve.   Orders:  Orders Placed This Encounter  Procedures  . Hand/UE Inj   No orders of the defined types were placed in this encounter.     Procedures: Hand/UE Inj: L long A1 for trigger finger on 04/06/2020 2:27 PM Medications: 0.5 mL lidocaine 1 %; 20 mg methylPREDNISolone acetate 40 MG/ML      Clinical Data: No additional findings.   Subjective: Chief Complaint  Patient presents with  . Left Hand - Pain  The patient comes in today with pain over the left middle finger A1 pulley.  He is actually had triggering of the right hand before and I placed a trigger injection with steroid over the A1 pulley of the right hand and he did great.  He has been having locking catching and triggering of the left middle finger for the last few weeks.  He did have his Covid 19 booster last evening.  He is not a diabetic and with just doing half dose of the steroid I felt this was still appropriate to do this today.  He is a very healthy individual otherwise.  He denies any numbness and tingling or other injuries or acute changes in his medical status and again he is not a diabetic.  HPI  Review of Systems There is no listed headache, chest pain, shortness of breath, fever, chills, nausea, vomiting  Objective: Vital Signs:  There were no vitals taken for this visit.  Physical Exam He is alert and orient x3 and in no acute distress Ortho Exam Examination of his left hand shows pain over the A1 pulley of the middle finger with active triggering.  The remainder of his hand exam is normal. Specialty Comments:  No specialty comments available.  Imaging: No results found.   PMFS History: Patient Active Problem List   Diagnosis Date Noted  . Chronic intermittent hypoxia with obstructive sleep apnea 01/10/2020  . 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 Abnormal dreams 12/16/2019   Past Medical History:  Diagnosis Date  . HLD (hyperlipidemia)   . Hypertension   . Hypokalemia   . Major depression     History reviewed. No pertinent family history.  History reviewed. No pertinent surgical history. Social History   Occupational History  . Not on file  Tobacco Use  . Smoking status: Former Smoker    Quit date: 1980    Years since quitting: 41.7  . Smokeless tobacco: Never Used  Substance and Sexual Activity  . Alcohol use: Not Currently  . Drug use: Not Currently  . Sexual activity: Not on file

## 2020-04-12 ENCOUNTER — Ambulatory Visit (INDEPENDENT_AMBULATORY_CARE_PROVIDER_SITE_OTHER): Payer: Medicare Other | Admitting: Neurology

## 2020-04-12 ENCOUNTER — Encounter: Payer: Self-pay | Admitting: Neurology

## 2020-04-12 VITALS — BP 132/78 | HR 58 | Ht 69.0 in | Wt 192.0 lb

## 2020-04-12 DIAGNOSIS — Z7189 Other specified counseling: Secondary | ICD-10-CM | POA: Diagnosis not present

## 2020-04-12 DIAGNOSIS — G4752 REM sleep behavior disorder: Secondary | ICD-10-CM

## 2020-04-12 NOTE — Patient Instructions (Signed)
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
## 2020-05-03 ENCOUNTER — Encounter: Payer: Self-pay | Admitting: Orthopaedic Surgery

## 2020-05-23 ENCOUNTER — Ambulatory Visit (INDEPENDENT_AMBULATORY_CARE_PROVIDER_SITE_OTHER): Payer: Medicare Other | Admitting: Orthopaedic Surgery

## 2020-05-23 ENCOUNTER — Encounter: Payer: Self-pay | Admitting: Orthopaedic Surgery

## 2020-05-23 DIAGNOSIS — M65332 Trigger finger, left middle finger: Secondary | ICD-10-CM

## 2020-05-23 DIAGNOSIS — M25531 Pain in right wrist: Secondary | ICD-10-CM

## 2020-05-23 MED ORDER — METHYLPREDNISOLONE ACETATE 40 MG/ML IJ SUSP
40.0000 mg | INTRAMUSCULAR | Status: AC | PRN
  Administered 2020-05-23: 40 mg

## 2020-05-23 MED ORDER — LIDOCAINE HCL 1 % IJ SOLN
1.0000 mL | INTRAMUSCULAR | Status: AC | PRN
  Administered 2020-05-23: 1 mL

## 2020-05-23 MED ORDER — METHYLPREDNISOLONE ACETATE 40 MG/ML IJ SUSP
40.0000 mg | INTRAMUSCULAR | Status: AC | PRN
  Administered 2020-05-23: 40 mg via INTRA_ARTICULAR

## 2020-05-23 NOTE — Progress Notes (Signed)
Office Visit Note   Patient: Gregory Perez           Date of Birth: 18-Apr-1941           MRN: [id number] Visit Date: 05/23/2020              Requested by: [name], DO [address] [place],  [place] [address] PCP: [name], DO   Assessment & Plan: Visit Diagnoses:  1. Pain in right wrist   2. Trigger finger, left middle finger     Plan: I provided steroid injections in his left middle finger at the A1 pulley and his right wrist joint.  He will continue Voltaren gel on these areas.  He understands that I can easily see him back for other injections if needed.  Follow-Up Instructions: Return if symptoms worsen or fail to improve.   Orders:  Orders Placed This Encounter  Procedures  . Hand/UE Inj  . Medium Joint Inj   No orders of the defined types were placed in this encounter.     Procedures: Hand/UE Inj: L long A1 for trigger finger on 05/23/2020 10:13 AM Medications: 1 mL lidocaine 1 %; 40 mg methylPREDNISolone acetate 40 MG/ML  Medium Joint Inj: R radiocarpal on 05/23/2020 10:14 AM Details: dorsal approach Medications: 1 mL lidocaine 1 %; 40 mg methylPREDNISolone acetate 40 MG/ML      Clinical Data: No additional findings.   Subjective: Chief Complaint  Patient presents with  . Left Hand - Pain  . Right Wrist - Pain  Gregory Perez is well-known to me.  He comes in today with recurrent trigger finger of his left middle finger.  I injected that there about 6 weeks ago.  He is not a diabetic.  I do feel comfortable injecting that again.  He is also some pain of the ring finger on the left side.  His right middle fingers been having some pain as well.  He also has right wrist pain and basilar thumb joint pain on the right thumb.  He had injection of the basilar thumb joint years ago (approximate 10 years ago).  He is an active 79 year old gentleman and did play a lot of tennis before as well.  HPI  Review of Systems He currently denies any headache,  chest pain, shortness of breath, fever, chills, nausea, vomiting  Objective: Vital Signs: There were no vitals taken for this visit.  Physical Exam He is alert and orient x3 and in no acute distress Ortho Exam Examination of his left hand shows pain over the A1 pulley of the middle and ring fingers.  Examination of the right hand shows pain over the A1 pulley of the middle finger.  He also has a positive grind test at the basilar thumb joint on the right side and wrist pain with flexion extension on the right side.  There is no swelling of either hand.  They are well perfused with normal sensation and excellent range of motion. Specialty Comments:  No specialty comments available.  Imaging: No results found.   PMFS History: Patient Active Problem List   Diagnosis Date Noted  . Encounter for counseling on use of bilevel positive airway pressure (BiPAP) therapy 04/12/2020  . Controlled REM sleep behavior disorder 04/12/2020  . Chronic intermittent hypoxia with obstructive sleep apnea 01/10/2020  . 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 Abnormal dreams 12/16/2019   Past Medical History:  Diagnosis Date  . HLD (hyperlipidemia)   . Hypertension   .  Hypokalemia   . Major depression     History reviewed. No pertinent family history.  History reviewed. No pertinent surgical history. Social History   Occupational History  . Not on file  Tobacco Use  . Smoking status: Former Smoker    Quit date: 1980    Years since quitting: 41.9  . Smokeless tobacco: Never Used  Substance and Sexual Activity  . Alcohol use: Not Currently  . Drug use: Not Currently  . Sexual activity: Not on file

## 2020-10-08 ENCOUNTER — Encounter: Payer: Self-pay | Admitting: Neurology

## 2021-05-04 ENCOUNTER — Encounter: Payer: Self-pay | Admitting: Cardiology

## 2021-05-04 ENCOUNTER — Other Ambulatory Visit: Payer: Self-pay

## 2021-05-04 ENCOUNTER — Ambulatory Visit: Payer: Medicare Other | Admitting: Cardiology

## 2021-05-04 VITALS — BP 142/78 | HR 58 | Temp 98.0°F | Resp 16 | Ht 69.0 in | Wt 200.0 lb

## 2021-05-04 DIAGNOSIS — R002 Palpitations: Secondary | ICD-10-CM | POA: Insufficient documentation

## 2021-05-04 NOTE — Progress Notes (Signed)
Patient referred by [name], DO for palpitations  Subjective:   Gregory Perez, male    DOB: [birth year], 80 y.o.   MRN: [id number]   Chief Complaint  Patient presents with   Palpitations   New Patient (Initial Visit)     HPI  80 y.o. Caucasian male with hypertension, hyperlipidemia, depression, referred for evaluation of palpitations.  Patient and his wife recently moved from [place] to [place]. During the move, he was very stressed and recalls having had palpitations lasting for few min with mental stress. He has not had any palpitations in several weeks. He stayed physical active with moving and lifting boxes etc. He denies having had any chest pain or palpitations with physical exertion. He is currently wearing an Apple Watch and has not noticed any alerts for irregular heart rhythm,    Past Medical History:  Diagnosis Date   HLD (hyperlipidemia)    Hypertension    Hypokalemia    Major depression      History reviewed. No pertinent surgical history.   Social History   Tobacco Use  Smoking Status Former   Packs/day: 3.00   Years: 10.00   Pack years: 30.00   Types: Cigarettes   Quit date: 1980   Years since quitting: 42.8  Smokeless Tobacco Never    Social History   Substance and Sexual Activity  Alcohol Use Yes   Comment: occ     History reviewed. No pertinent family history.   Current Outpatient Medications on File Prior to Visit  Medication Sig Dispense Refill   amLODipine (NORVASC) 10 MG tablet      aspirin EC 81 MG tablet Take 81 mg by mouth daily.     atorvastatin (LIPITOR) 40 MG tablet Take 40 mg by mouth.      buPROPion (WELLBUTRIN XL) 150 MG 24 hr tablet Take 150 mg by mouth at bedtime.     chlorthalidone (HYGROTON) 25 MG tablet Take 25 mg by mouth daily.     Cholecalciferol (VITAMIN D3) 250 MCG (10000 UT) TABS Take 1 tablet by mouth daily.     cyanocobalamin 1000 MCG tablet Take 1,000 mcg by mouth daily.     finasteride  (PROSCAR) 5 MG tablet Take by mouth.     loratadine (CLARITIN) 10 MG tablet Take 10 mg by mouth daily.     losartan (COZAAR) 100 MG tablet Take 100 mg by mouth daily.     No current facility-administered medications on file prior to visit.    Cardiovascular and other pertinent studies:  EKG 05/04/2021: Sinus rhythm 62 bpm Possible old inferior infarct Poor R wave progression  No results found for this or any previous visit from the past 1095 days.     Recent labs: 04/18/2021: Glucose 96, BUN/Cr 29/1.0. EGFR 64. HbA1C 5.4% Chol 153, TG 111, HDL 40, LDL 91 TSH 0.9 normal    Review of Systems  Cardiovascular:  Positive for palpitations. Negative for chest pain, dyspnea on exertion, leg swelling and syncope.        Vitals:   05/04/21 1435  BP: (!) 142/78  Pulse: (!) 58  Resp: 16  Temp: 98 F (36.7 C)  SpO2: 96%     Body mass index is 29.53 kg/m. Filed Weights   05/04/21 1435  Weight: 200 lb (90.7 kg)     Objective:   Physical Exam Vitals and nursing note reviewed.  Constitutional:      General: He is not in acute distress. Neck:  Vascular: No JVD.  Cardiovascular:     Rate and Rhythm: Normal rate and regular rhythm.     Heart sounds: Normal heart sounds. No murmur heard. Pulmonary:     Effort: Pulmonary effort is normal.     Breath sounds: Normal breath sounds. No wheezing or rales.  Musculoskeletal:     Right lower leg: No edema.     Left lower leg: No edema.        Assessment & Recommendations:    80 y.o. Caucasian male with hypertension, hyperlipidemia, depression, referred for evaluation of palpitations.  Palpitations: No recent episodes. Likely to have low yield with cardiac monitor. Given the correlation with stress, his risk factors for CAD, recommend exercise treadmill stress test. Should he have recurrent palpitations symptoms, will consider cardiac telemetry,     Thank you for referring the patient to [place]. Please feel free to  contact with any questions.   Nigel Mormon, MD Pager: [phone] Office: [phone]

## 2021-05-14 ENCOUNTER — Other Ambulatory Visit: Payer: Self-pay

## 2021-05-14 ENCOUNTER — Ambulatory Visit: Payer: Medicare Other

## 2021-05-14 DIAGNOSIS — R002 Palpitations: Secondary | ICD-10-CM

## 2021-05-18 NOTE — Progress Notes (Signed)
Discussed the results with the patient. No specific angina/angina equivalent symptoms. Please schedule office visit f/u in 3 months.  Thanks MJP

## 2021-06-03 IMAGING — MR MR CERVICAL SPINE W/O CM
4 of 5 series · 28 of 48 positions shown · non-contrast
Comparison: Cervical spine radiographs 01/05/2019

CLINICAL DATA: Cervical spinal stenosis. Left arm pain and
weakness.

EXAM:
MRI CERVICAL SPINE WITHOUT CONTRAST
TECHNIQUE: Multiplanar, multisequence MR imaging of the cervical spine was
performed. No intravenous contrast was administered.

[Series 4: T1 · sagittal · 3.0mm · 0.41mm/px · 6 of 13 slices shown]
[im 1/13]
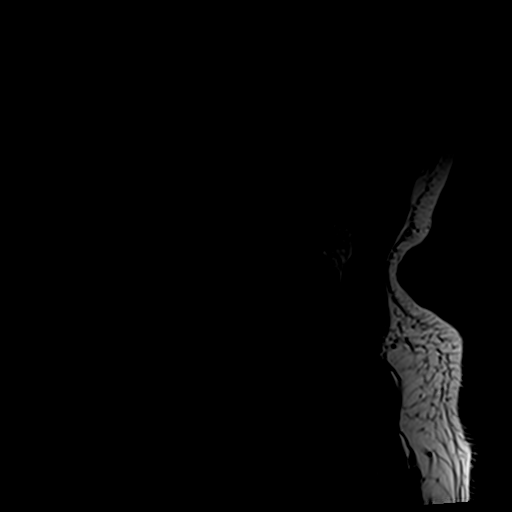
[im 3/13]
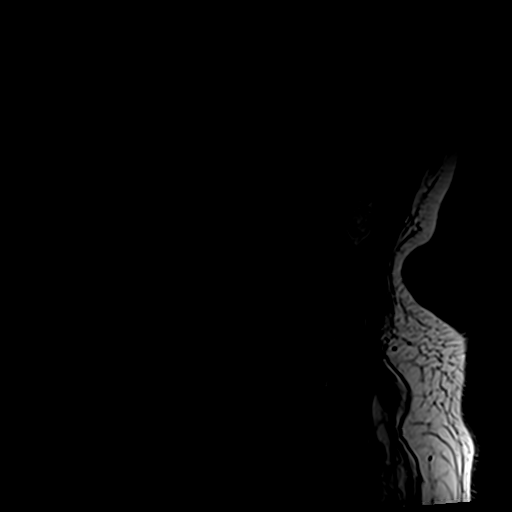
[im 5/13]
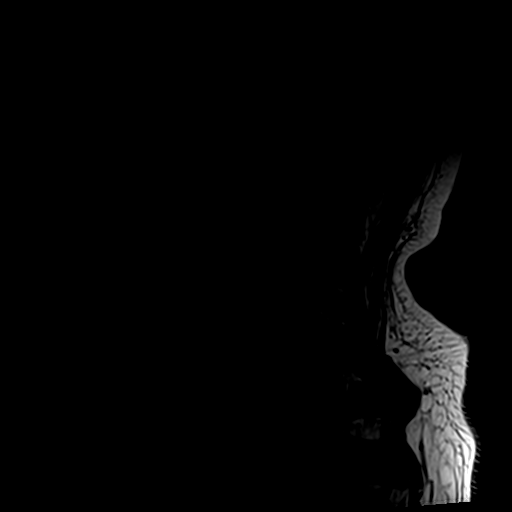
[im 8/13]
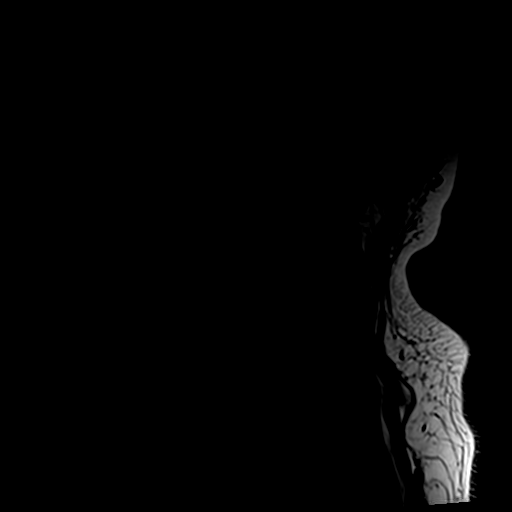
[im 10/13]
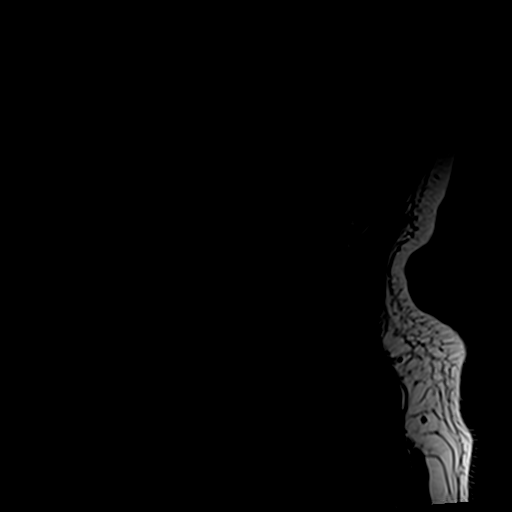
[im 13/13]
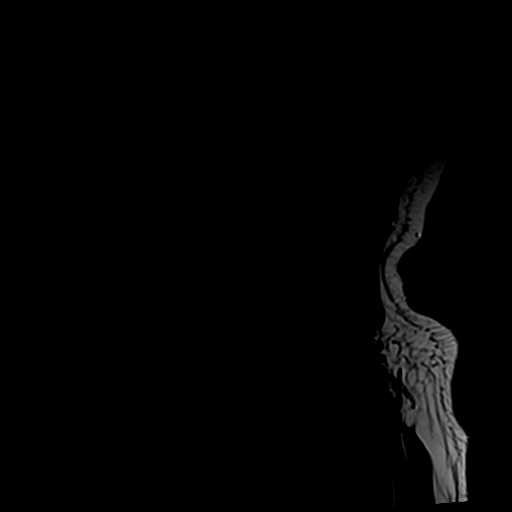

[Series 5: tir sag · sagittal · 3.0mm · 0.41mm/px · 7 of 13 slices shown]
[im 1/13]
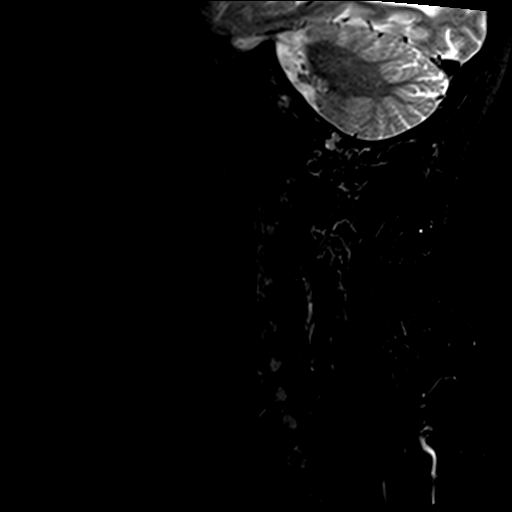
[im 3/13]
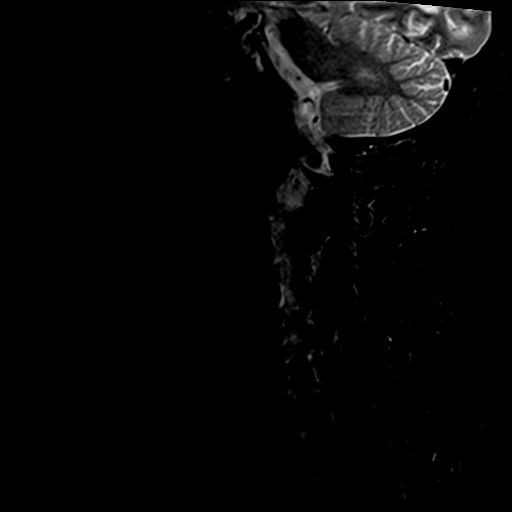
[im 5/13]
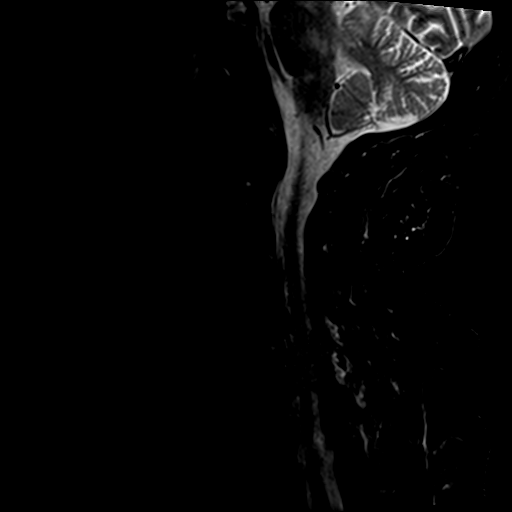
[im 7/13]
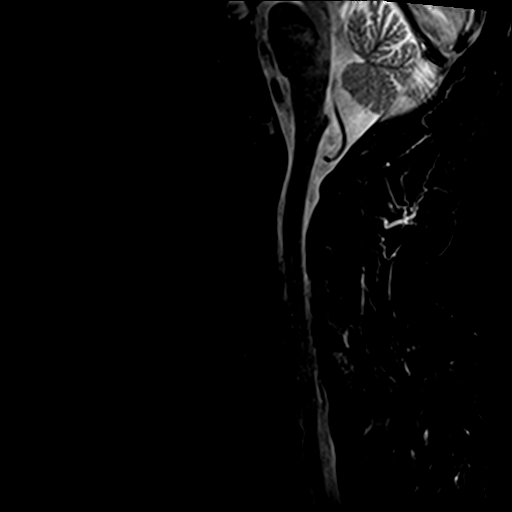
[im 9/13]
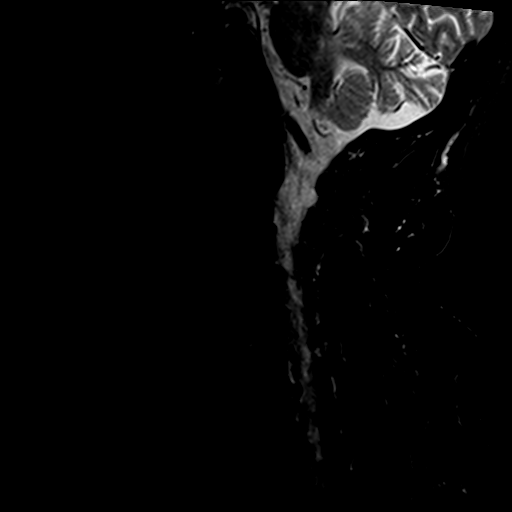
[im 11/13]
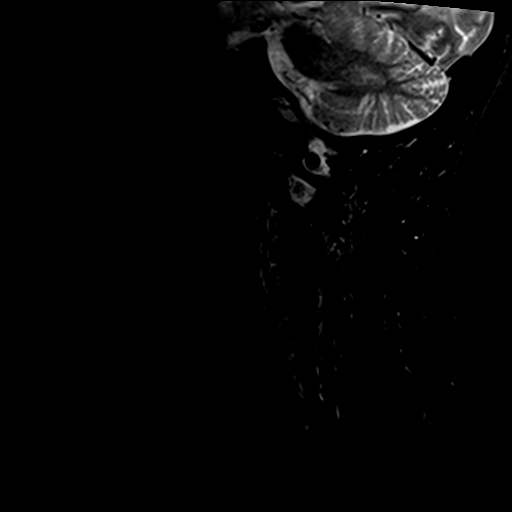
[im 13/13]
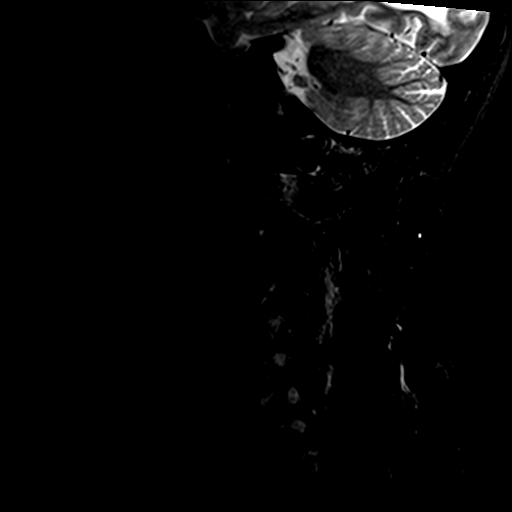

[Series 6: T2 · sagittal · 3.0mm · 0.66mm/px · 7 of 13 slices shown (1 of 2)]
[im 1/13]
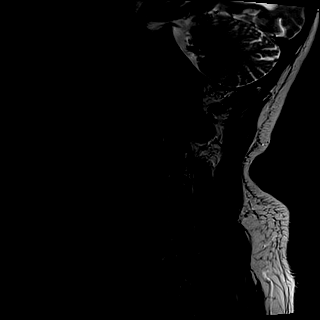
[im 3/13]
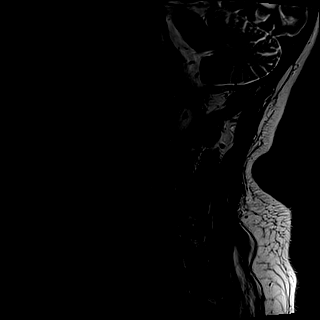
[im 5/13]
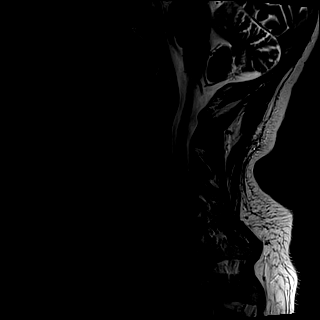
[im 7/13]
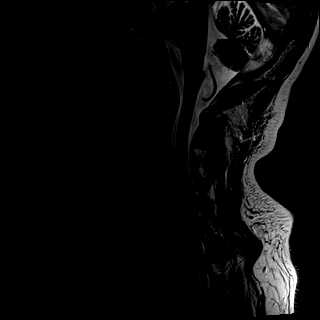
[im 9/13]
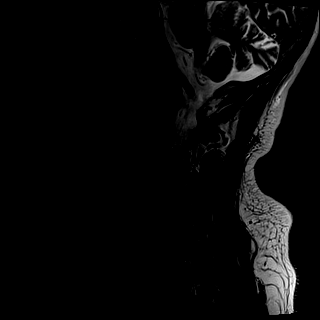
[im 11/13]
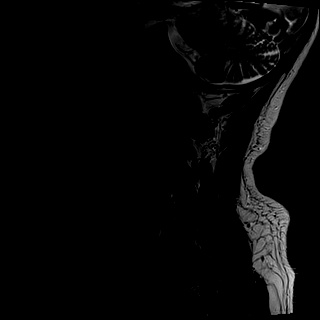
[im 13/13]
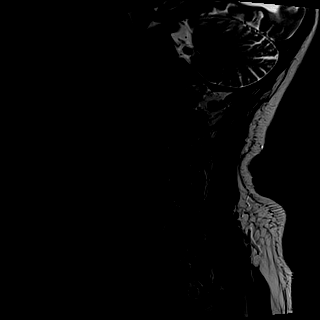

[Series 8: T2 · axial · 3.0mm · 0.70mm/px · z∈[-95,+5]mm · 8 of 28 slices shown (2 of 2)]
[im 1/28]
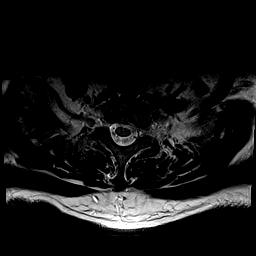
[im 5/28]
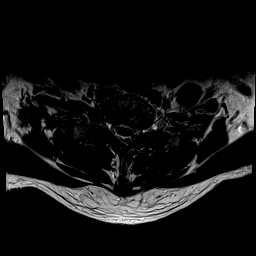
[im 9/28]
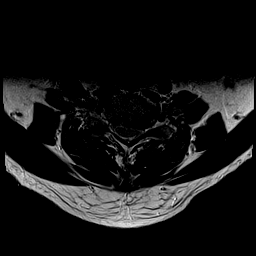
[im 13/28]
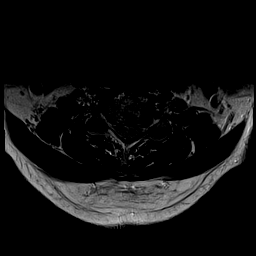
[im 15/28]
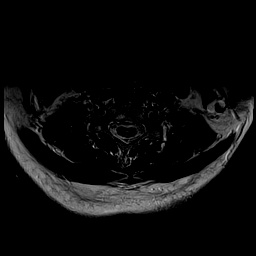
[im 19/28]
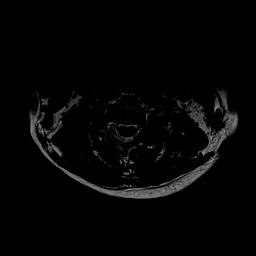
[im 23/28]
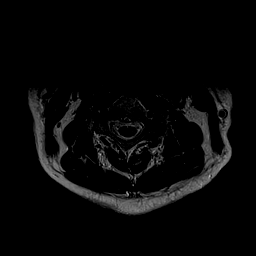
[im 28/28]
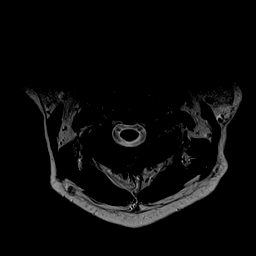

[28 of 48 positions shown; findings below may reference images not displayed]

FINDINGS: Alignment: Mild anterolisthesis at C3-4. Straightening of the
cervical lordosis.

Vertebrae: Negative for fracture or mass.

Cord: Normal cord signal.  No cord compression or cord lesion.

Posterior Fossa, vertebral arteries, paraspinal tissues: Negative
for paraspinous mass.

Patchy hyperintensity in the pons likely due to chronic
microvascular ischemia.

Disc levels:

C2-3: Small central disc protrusion. Mild facet degeneration on the
left. Mild left foraminal narrowing due to spurring.

C3-4: Mild anterolisthesis. Disc and facet degeneration. Moderate
left foraminal narrowing due to spurring.

C4-5: Disc degeneration and diffuse uncinate spurring. Cord
flattening with mild spinal stenosis. Mild foraminal narrowing
bilaterally due to spurring

C5-6: Disc degeneration and spondylosis. Cord flattening with mild
to moderate spinal stenosis. Moderate foraminal narrowing
bilaterally due to spurring, more severe on the left than the right

C6-7: Moderate left foraminal narrowing due to spurring. Cord
flattening and mild spinal stenosis

C7-T1: Small central disc protrusion. Disc degeneration and spurring
causing mild foraminal narrowing bilaterally, left greater than
right.
IMPRESSION: Multilevel cervical spondylosis. Multilevel spinal and foraminal
encroachment throughout the cervical spine as described above.

Patchy hyperintensity in the pons bilaterally most likely chronic
microvascular ischemic change.

## 2021-08-16 ENCOUNTER — Ambulatory Visit: Payer: Medicare Other | Admitting: Cardiology

## 2021-08-16 ENCOUNTER — Encounter: Payer: Self-pay | Admitting: Cardiology

## 2021-08-16 ENCOUNTER — Other Ambulatory Visit: Payer: Self-pay

## 2021-08-16 VITALS — BP 129/74 | HR 57 | Temp 98.0°F | Resp 16 | Ht 69.0 in | Wt 199.0 lb

## 2021-08-16 DIAGNOSIS — R9439 Abnormal result of other cardiovascular function study: Secondary | ICD-10-CM | POA: Insufficient documentation

## 2021-08-16 DIAGNOSIS — R002 Palpitations: Secondary | ICD-10-CM

## 2021-08-16 NOTE — Progress Notes (Signed)
Patient referred by [name], DO for palpitations  Subjective:   Gregory Perez, male    DOB: [birth year], 81 y.o.   MRN: [id number]   Chief Complaint  Patient presents with   Palpitations   Follow-up    3 months     HPI  81 y.o. Caucasian male with hypertension, hyperlipidemia, depression, referred for evaluation of palpitations.  Patient underwent exercise treadmill stress test in 05/2021 due to his symptoms of palpitations. He has not had any recurrent palpitations since. He only occasionally has dyspnea on exertion/. He denies any chest pain.   He is now wearing a new Apple watch. He has not seen any alerts on it.   Current Outpatient Medications on File Prior to Visit  Medication Sig Dispense Refill   amLODipine (NORVASC) 10 MG tablet      atorvastatin (LIPITOR) 20 MG tablet Take 40 mg by mouth.      buPROPion (WELLBUTRIN XL) 150 MG 24 hr tablet Take 150 mg by mouth at bedtime.     chlorthalidone (HYGROTON) 25 MG tablet Take 25 mg by mouth daily.     cyanocobalamin 1000 MCG tablet Take 1,000 mcg by mouth daily.     finasteride (PROSCAR) 5 MG tablet Take by mouth.     losartan (COZAAR) 100 MG tablet Take 100 mg by mouth daily.     potassium chloride (KLOR-CON) 10 MEQ tablet Take 10 mEq by mouth daily.     No current facility-administered medications on file prior to visit.    Cardiovascular and other pertinent studies:  Exercise treadmill stress test 05/14/2021: Exercise treadmill stress test performed using Bruce protocol.  Patient reached 7 METS, and 80% of age predicted maximum heart rate.  Exercise capacity was low. No chest pain reported.  Dyspnea reported.  Normal heart rate and hemodynamic response. Stress EKG at 80% MPHR showed sinus tachycardia, T wave inversion in leads II, III, aVF, return to baseline within 2 min into recovery. These changes are equivocal for ischemic at suboptimal exercise. No arrythmia seen.  Recommend clinical correlation.      EKG 05/04/2021: Sinus rhythm 62 bpm Possible old inferior infarct Poor R wave progression  No results found for this or any previous visit from the past 1095 days.     Recent labs: 04/18/2021: Glucose 96, BUN/Cr 29/1.0. EGFR 64. HbA1C 5.4% Chol 153, TG 111, HDL 40, LDL 91 TSH 0.9 normal    Review of Systems  Cardiovascular:  Positive for palpitations. Negative for chest pain, dyspnea on exertion, leg swelling and syncope.        Vitals:   08/16/21 1301  BP: 129/74  Pulse: (!) 57  Resp: 16  Temp: 98 F (36.7 C)  SpO2: 95%     Body mass index is 29.39 kg/m. Filed Weights   08/16/21 1301  Weight: 199 lb (90.3 kg)     Objective:   Physical Exam Vitals and nursing note reviewed.  Constitutional:      General: He is not in acute distress. Neck:     Vascular: No JVD.  Cardiovascular:     Rate and Rhythm: Normal rate and regular rhythm.     Heart sounds: Normal heart sounds. No murmur heard. Pulmonary:     Effort: Pulmonary effort is normal.     Breath sounds: Normal breath sounds. No wheezing or rales.  Musculoskeletal:     Right lower leg: No edema.     Left lower leg: No edema.  Assessment & Recommendations:   81 y.o. Caucasian male with hypertension, hyperlipidemia, depression, referred for evaluation of palpitations.  Palpitations: No recent episodes.  Dyspnea on exertion: Occasional. Equivocal exercise treadmill stress test. Monitor symptoms for now. Continue current hypertension and hyperlipidemia management.  F/u in 6 months     Nigel Mormon, MD Pager: [phone] Office: [phone]

## 2021-11-09 ENCOUNTER — Encounter: Payer: Self-pay | Admitting: Cardiology

## 2021-11-12 ENCOUNTER — Telehealth: Payer: Self-pay

## 2021-11-12 NOTE — Telephone Encounter (Signed)
You may add him to my schedule. Make a note to check orthostatics and advise patient to bring the log of BP with him

## 2021-11-12 NOTE — Telephone Encounter (Signed)
Pt called to inform us that On and off I have had a minor balance issue. But Thursday night May 4 he was walking outside and had to stop. one of my friends walked me home. Had headaches as well.pt is concerned and wonder if his meds are off some and need to be looked at. Per pt ?"I don?t like this feeling. When we lived in [place] I lost balance and slide down a hill." ?He is logging BP?s and general feelings over the weekend. Please advise ?

## 2021-11-13 ENCOUNTER — Ambulatory Visit: Payer: Medicare Other | Admitting: Student

## 2021-11-13 ENCOUNTER — Encounter: Payer: Self-pay | Admitting: Student

## 2021-11-13 ENCOUNTER — Encounter: Payer: Self-pay | Admitting: Cardiology

## 2021-11-13 VITALS — BP 113/68 | HR 74 | Temp 98.6°F | Resp 16 | Ht 69.0 in | Wt 199.2 lb

## 2021-11-13 DIAGNOSIS — R0609 Other forms of dyspnea: Secondary | ICD-10-CM

## 2021-11-13 DIAGNOSIS — R002 Palpitations: Secondary | ICD-10-CM

## 2021-11-13 DIAGNOSIS — I10 Essential (primary) hypertension: Secondary | ICD-10-CM

## 2021-11-13 NOTE — Telephone Encounter (Signed)
From patient.

## 2021-11-13 NOTE — Progress Notes (Signed)
? ? ?Patient referred by [name], DO for palpitations ? ?Subjective:  ? ?Gregory Perez, male    DOB: October 17, 1940, 81 y.o.   MRN: [id number] ? ? ?Chief Complaint  ?Patient presents with  ? Hypotension  ? Palpitations  ? ? ? ?HPI ? ?81 y.o. Caucasian male with hypertension, hyperlipidemia, depression, originally  referred for evaluation of palpitations.  ? ?Patient was last seen in our office 08/16/2021, however he called the office 11/09/2021 with concerns of "minor balance issue".  He now presents for follow-up.  Patient reports over the last several weeks he is been working to increase physical activity and recently while walking with his wife noticed he became "wobbly" and turned around to walk home.  He is also had some mild dyspnea on exertion.  States his feeling of unsteadiness is worse when standing up.  Patient notes that he has been relatively sedentary over the last several months.  Denies chest pain, palpitations. ? ?Current Outpatient Medications on File Prior to Visit  ?Medication Sig Dispense Refill  ? amLODipine (NORVASC) 10 MG tablet     ? atorvastatin (LIPITOR) 20 MG tablet Take 20 mg by mouth.    ? buPROPion (WELLBUTRIN XL) 150 MG 24 hr tablet Take 150 mg by mouth at bedtime.    ? chlorthalidone (HYGROTON) 25 MG tablet Take 12.5 mg by mouth daily.    ? Cholecalciferol (VITAMIN D3) 25 MCG (1000 UT) CAPS Take by mouth.    ? cyanocobalamin 1000 MCG tablet Take 1,000 mcg by mouth daily.    ? finasteride (PROSCAR) 5 MG tablet Take by mouth.    ? FLUoxetine (PROZAC) 20 MG capsule TAKE 1 CAPSULE BY MOUTH EVERY DAY WITH 40 MG for 90    ? FLUoxetine (PROZAC) 40 MG capsule Take by mouth.    ? losartan (COZAAR) 100 MG tablet Take 100 mg by mouth daily.    ? potassium chloride (KLOR-CON) 10 MEQ tablet Take 10 mEq by mouth daily.    ? ?No current facility-administered medications on file prior to visit.  ? ? ?Cardiovascular and other pertinent studies: ?EKG 11/13/2021:  ?Sinus rhythm at a rate of 57 bpm.   Normal axis.  Possible old inferior infarct.  Poor R wave progression, cannot exclude anteroseptal infarct old.  Compared to EKG 05/04/2021, no significant change. ? ?Exercise treadmill stress test 05/14/2021: ?Exercise treadmill stress test performed using Bruce protocol.  Patient reached 7 METS, and 80% of age predicted maximum heart rate.  Exercise capacity was low. No chest pain reported.  Dyspnea reported.  Normal heart rate and hemodynamic response. ?Stress EKG at 80% MPHR showed sinus tachycardia, T wave inversion in leads II, III, aVF, return to baseline within 2 min into recovery. These changes are equivocal for ischemic at suboptimal exercise. No arrythmia seen.  ?Recommend clinical correlation.   ?  ?EKG 05/04/2021: ?Sinus rhythm 62 bpm ?Possible old inferior infarct ?Poor R wave progression ? ?No results found for this or any previous visit from the past 1095 days. ?  ? ? ?Recent labs: ?04/18/2021: ?Glucose 96, BUN/Cr 29/1.0. EGFR 64. ?HbA1C 5.4% ?Chol 153, TG 111, HDL 40, LDL 91 ?TSH 0.9 normal ? ? ? ?Review of Systems  ?Cardiovascular:  Positive for dyspnea on exertion (mild, occasional). Negative for chest pain, leg swelling, palpitations and syncope.  ?Neurological:  Positive for light-headedness.  ? ?   ? ? ?Vitals:  ? 11/13/21 1322 11/13/21 1323  ?BP:    ?Pulse:    ?Resp:    ?  Temp:    ?SpO2: 96% 96%  ? ? ? ?Body mass index is 29.42 kg/m?. ?Filed Weights  ? 11/13/21 1316  ?Weight: 199 lb 3.2 oz (90.4 kg)  ? ?Orthostatic VS for the past 72 hrs (Last 3 readings): ? Orthostatic BP Patient Position BP Location Cuff Size Orthostatic Pulse  ?11/13/21 1323 112/68 Standing Left Arm Normal 78  ?11/13/21 1322 115/77 Sitting Left Arm Normal 76  ?11/13/21 1321 137/73 Supine Left Arm Normal 63  ? ? ? ? ?Objective:  ? Physical Exam ?Vitals reviewed.  ?Constitutional:   ?   General: He is not in acute distress. ?Neck:  ?   Vascular: No JVD.  ?Cardiovascular:  ?   Rate and Rhythm: Normal rate and regular rhythm.  ?    Heart sounds: Normal heart sounds. No murmur heard. ?Pulmonary:  ?   Effort: Pulmonary effort is normal.  ?   Breath sounds: Normal breath sounds. No wheezing or rales.  ?Musculoskeletal:  ?   Right lower leg: No edema.  ?   Left lower leg: No edema.  ?Physical exam unchanged compared to previous office visit ? ?   ? ?Assessment & Recommendations:  ? ?81 y.o. Caucasian male with hypertension, hyperlipidemia, depression, originally referred for evaluation of palpitations. ? ?Imbalance:  ?Patient's blood pressure is soft in the office today.  Suspect this is contributing to patient's symptoms of "unsteadiness" and potentially mild dyspnea on exertion.  We will therefore reduce chlorthalidone from 25 mg to 12.5 mg p.o. daily.  Patient will monitor blood pressure at home and notify our office if it is consistently >130/80 mmHg. ?EKG unchanged compared to previous. ? ?Palpitations: ?No recent episodes. ? ?Dyspnea on exertion: ?Occasional. Equivocal exercise treadmill stress test. ?Suspect patient's deconditioning may be contributing to current symptoms.  We will continue to monitor for now. ?EKG unchanged compared to previous.  ? ?Follow up in 3 months as previously scheduled.  ? ? ?Alethia Berthold, PA-C ?11/13/2021, 1:56 PM ?Office: [phone] ? ?

## 2021-11-13 NOTE — Telephone Encounter (Signed)
Please give him an anppt

## 2021-11-23 NOTE — Telephone Encounter (Signed)
From patient.

## 2021-11-26 NOTE — Telephone Encounter (Signed)
Please advise patient to monitor BP and keep a written log. Can add him to my schedule then end of this week or some time next week.

## 2021-11-26 NOTE — Telephone Encounter (Signed)
From pt

## 2021-12-04 NOTE — Progress Notes (Unsigned)
Patient referred by [name], DO for palpitations  Subjective:   Gregory Perez, male    DOB: 12-10-1940, 81 y.o.   MRN: [id number]   No chief complaint on file.    HPI  81 y.o. Caucasian male with hypertension, hyperlipidemia, depression, originally  referred for evaluation of palpitations.   Patient was last seen in our office 11/13/2021 at which time patient's blood pressure was soft and he was complaining of unsteadiness and mild dyspnea on exertion.  Therefore chlorthalidone was reduced from 25 mg to 12.5 mg p.o. daily.  Patient now presents for follow-up. ***  ***  Patient was last seen in our office 08/16/2021, however he called the office 11/09/2021 with concerns of "minor balance issue".  He now presents for follow-up.  Patient reports over the last several weeks he is been working to increase physical activity and recently while walking with his wife noticed he became "wobbly" and turned around to walk home.  He is also had some mild dyspnea on exertion.  States his feeling of unsteadiness is worse when standing up.  Patient notes that he has been relatively sedentary over the last several months.  Denies chest pain, palpitations.  Current Outpatient Medications on File Prior to Visit  Medication Sig Dispense Refill   amLODipine (NORVASC) 10 MG tablet      atorvastatin (LIPITOR) 20 MG tablet Take 20 mg by mouth.     buPROPion (WELLBUTRIN XL) 150 MG 24 hr tablet Take 150 mg by mouth at bedtime.     chlorthalidone (HYGROTON) 25 MG tablet Take 12.5 mg by mouth daily.     Cholecalciferol (VITAMIN D3) 25 MCG (1000 UT) CAPS Take by mouth.     cyanocobalamin 1000 MCG tablet Take 1,000 mcg by mouth daily.     finasteride (PROSCAR) 5 MG tablet Take by mouth.     FLUoxetine (PROZAC) 20 MG capsule TAKE 1 CAPSULE BY MOUTH EVERY DAY WITH 40 MG for 90     FLUoxetine (PROZAC) 40 MG capsule Take by mouth.     losartan (COZAAR) 100 MG tablet Take 100 mg by mouth daily.     potassium  chloride (KLOR-CON) 10 MEQ tablet Take 10 mEq by mouth daily.     No current facility-administered medications on file prior to visit.    Cardiovascular and other pertinent studies: EKG 11/13/2021:  Sinus rhythm at a rate of 57 bpm.  Normal axis.  Possible old inferior infarct.  Poor R wave progression, cannot exclude anteroseptal infarct old.  Compared to EKG 05/04/2021, no significant change.  Exercise treadmill stress test 05/14/2021: Exercise treadmill stress test performed using Bruce protocol.  Patient reached 7 METS, and 80% of age predicted maximum heart rate.  Exercise capacity was low. No chest pain reported.  Dyspnea reported.  Normal heart rate and hemodynamic response. Stress EKG at 80% MPHR showed sinus tachycardia, T wave inversion in leads II, III, aVF, return to baseline within 2 min into recovery. These changes are equivocal for ischemic at suboptimal exercise. No arrythmia seen.  Recommend clinical correlation.     EKG 05/04/2021: Sinus rhythm 62 bpm Possible old inferior infarct Poor R wave progression  No results found for this or any previous visit from the past 1095 days.     Recent labs: 04/18/2021: Glucose 96, BUN/Cr 29/1.0. EGFR 64. HbA1C 5.4% Chol 153, TG 111, HDL 40, LDL 91 TSH 0.9 normal    Review of Systems  Cardiovascular:  Positive for dyspnea on exertion (mild,  occasional). Negative for chest pain, leg swelling, palpitations and syncope.  Neurological:  Positive for light-headedness.        There were no vitals filed for this visit.    There is no height or weight on file to calculate BMI. There were no vitals filed for this visit.  No data found.     Objective:   Physical Exam Vitals reviewed.  Constitutional:      General: He is not in acute distress. Neck:     Vascular: No JVD.  Cardiovascular:     Rate and Rhythm: Normal rate and regular rhythm.     Heart sounds: Normal heart sounds. No murmur heard. Pulmonary:      Effort: Pulmonary effort is normal.     Breath sounds: Normal breath sounds. No wheezing or rales.  Musculoskeletal:     Right lower leg: No edema.     Left lower leg: No edema.  Physical exam unchanged compared to previous office visit      Assessment & Recommendations:   81 y.o. Caucasian male with hypertension, hyperlipidemia, depression, originally referred for evaluation of palpitations. *** Imbalance:  Patient's blood pressure is soft in the office today.  Suspect this is contributing to patient's symptoms of "unsteadiness" and potentially mild dyspnea on exertion.  We will therefore reduce chlorthalidone from 25 mg to 12.5 mg p.o. daily.  Patient will monitor blood pressure at home and notify our office if it is consistently >130/80 mmHg. EKG unchanged compared to previous.  Palpitations: No recent episodes.  Dyspnea on exertion: Occasional. Equivocal exercise treadmill stress test. Suspect patient's deconditioning may be contributing to current symptoms.  We will continue to monitor for now. EKG unchanged compared to previous.   Follow up in 3 months as previously scheduled.    Alethia Berthold, PA-C 12/04/2021, 3:26 PM Office: [phone]

## 2021-12-05 ENCOUNTER — Encounter: Payer: Self-pay | Admitting: Student

## 2021-12-05 ENCOUNTER — Ambulatory Visit: Payer: Medicare Other | Admitting: Student

## 2021-12-05 VITALS — BP 117/82 | HR 65 | Temp 98.4°F | Resp 16 | Ht 69.0 in | Wt 189.0 lb

## 2021-12-05 DIAGNOSIS — I10 Essential (primary) hypertension: Secondary | ICD-10-CM

## 2021-12-05 DIAGNOSIS — R0609 Other forms of dyspnea: Secondary | ICD-10-CM

## 2021-12-12 ENCOUNTER — Ambulatory Visit (INDEPENDENT_AMBULATORY_CARE_PROVIDER_SITE_OTHER): Payer: Medicare Other | Admitting: Orthopaedic Surgery

## 2021-12-12 ENCOUNTER — Encounter: Payer: Self-pay | Admitting: Orthopaedic Surgery

## 2021-12-12 DIAGNOSIS — M25551 Pain in right hip: Secondary | ICD-10-CM

## 2021-12-12 NOTE — Progress Notes (Signed)
[name] is actually one of the neighbors.  He comes in today for evaluation treatment of right hip pain.  He is 81 years old and is now working with a Systems analyst.  He is doing much better than when he set up this appointment.  It is on the lateral aspect of his right hip that is hurting him.  He has been working on stretching as well.  He denies any groin pain.  On exam his right hip moves smoothly and fluidly.  His pain is over the trochanteric area and the proximal IT band consistent with bursitis and tendinitis.  He did let me know about some balance issues that he was having but this may have been more related to his back and his hip.  He has also been seeing cardiologist and they are working on his blood pressure medications and that may have had an effect on him.  He says his pain now is only 1 out of 10.  I gave him reassurance that this should continue to improve.  I showed him some additional stretching exercises to try.  If this flares up again he can call to be set up for a steroid injection of his trochanteric area.  All question concerns were answered and addressed.

## 2022-02-13 ENCOUNTER — Encounter: Payer: Self-pay | Admitting: Cardiology

## 2022-02-13 ENCOUNTER — Ambulatory Visit: Payer: Medicare Other | Admitting: Cardiology

## 2022-02-13 VITALS — BP 135/74 | Temp 98.7°F | Resp 16 | Ht 69.0 in | Wt 194.4 lb

## 2022-02-13 DIAGNOSIS — I10 Essential (primary) hypertension: Secondary | ICD-10-CM

## 2022-02-13 DIAGNOSIS — I119 Hypertensive heart disease without heart failure: Secondary | ICD-10-CM | POA: Insufficient documentation

## 2022-02-13 DIAGNOSIS — R9439 Abnormal result of other cardiovascular function study: Secondary | ICD-10-CM

## 2022-02-13 NOTE — Progress Notes (Signed)
Patient referred by [name], DO for palpitations  Subjective:   Gregory Perez, male    DOB: [birth year], 81 y.o.   MRN: [id number]   No chief complaint on file.    HPI  81 y.o. Caucasian male with hypertension, hyperlipidemia, palpitations, mildly abnormal stress test  Patient recently had issues owing to his balance, which are improving after working with physical therapy and orthopedic surgeon.  Blood pressure is well-controlled.  He denies any chest pain or palpitations.    Current Outpatient Medications:    amLODipine (NORVASC) 10 MG tablet, , Disp: , Rfl:    atorvastatin (LIPITOR) 20 MG tablet, Take 20 mg by mouth., Disp: , Rfl:    buPROPion (WELLBUTRIN XL) 150 MG 24 hr tablet, Take 150 mg by mouth at bedtime., Disp: , Rfl:    chlorthalidone (HYGROTON) 25 MG tablet, Take 12.5 mg by mouth daily., Disp: , Rfl:    Cholecalciferol (VITAMIN D3) 25 MCG (1000 UT) CAPS, Take by mouth., Disp: , Rfl:    cyanocobalamin 1000 MCG tablet, Take 1,000 mcg by mouth daily., Disp: , Rfl:    finasteride (PROSCAR) 5 MG tablet, Take by mouth., Disp: , Rfl:    FLUoxetine (PROZAC) 20 MG capsule, TAKE 1 CAPSULE BY MOUTH EVERY DAY WITH 40 MG for 90, Disp: , Rfl:    FLUoxetine (PROZAC) 40 MG capsule, Take by mouth., Disp: , Rfl:    losartan (COZAAR) 100 MG tablet, Take 100 mg by mouth daily., Disp: , Rfl:    potassium chloride (KLOR-CON) 10 MEQ tablet, Take 10 mEq by mouth daily., Disp: , Rfl:     Cardiovascular and other pertinent studies:  EKG 02/13/2022: Sinus rhythm 52 bpm Cannot exclude old anteroseptal infarct Nonspecific T-abnormality  Exercise treadmill stress test 05/14/2021: Exercise treadmill stress test performed using Bruce protocol.  Patient reached 7 METS, and 80% of age predicted maximum heart rate.  Exercise capacity was low. No chest pain reported.  Dyspnea reported.  Normal heart rate and hemodynamic response. Stress EKG at 80% MPHR showed sinus tachycardia, T wave  inversion in leads II, III, aVF, return to baseline within 2 min into recovery. These changes are equivocal for ischemic at suboptimal exercise. No arrythmia seen.  Recommend clinical correlation.     Recent labs: 01/01/2022: Glucose 103, BUN/Cr 19/NA. EGFR 71.  04/18/2021: Glucose 96, BUN/Cr 29/1.0. EGFR 64. HbA1C 5.4% Chol 153, TG 111, HDL 40, LDL 91 TSH 0.9 normal    Review of Systems  Cardiovascular:  Positive for palpitations. Negative for chest pain, dyspnea on exertion, leg swelling and syncope.         Vitals:   02/13/22 1346  BP: 135/74  Resp: 16  Temp: 98.7 F (37.1 C)     Body mass index is 28.71 kg/m. Filed Weights   02/13/22 1346  Weight: 194 lb 6.4 oz (88.2 kg)     Objective:   Physical Exam Vitals and nursing note reviewed.  Constitutional:      General: He is not in acute distress. Neck:     Vascular: No JVD.  Cardiovascular:     Rate and Rhythm: Normal rate and regular rhythm.     Heart sounds: Normal heart sounds. No murmur heard. Pulmonary:     Effort: Pulmonary effort is normal.     Breath sounds: Normal breath sounds. No wheezing or rales.  Musculoskeletal:     Right lower leg: No edema.     Left lower leg: No edema.  Assessment & Recommendations:   81 y.o. Caucasian male with hypertension, hyperlipidemia, depression, referred for evaluation of palpitations.  Palpitations: No recent episodes.  Abnormal stress test:  No specific chest pain or dyspnea, but equivocal exercise treadmill stress test in the past. Will check CT cardiac scoring for risk stratification. Continue Lipitor 20 mg for now.  If elevated calcium score, will increase Lipitor to 40 mg daily, and consider aspirin 81 mg daily.  Hypertension: Well-controlled  F/u in 6 months     Nigel Mormon, MD Pager: [phone] Office: [phone]

## 2022-03-21 ENCOUNTER — Ambulatory Visit: Payer: Medicare Other | Admitting: Neurology

## 2022-04-29 ENCOUNTER — Ambulatory Visit
Admission: RE | Admit: 2022-04-29 | Discharge: 2022-04-29 | Disposition: A | Discharge status: Home / Self Care | Payer: No Typology Code available for payment source | Source: Ambulatory Visit | Attending: Cardiology | Admitting: Cardiology

## 2022-04-29 DIAGNOSIS — I119 Hypertensive heart disease without heart failure: Secondary | ICD-10-CM

## 2022-04-29 DIAGNOSIS — I10 Essential (primary) hypertension: Secondary | ICD-10-CM

## 2022-05-01 ENCOUNTER — Encounter: Payer: Self-pay | Admitting: Cardiology

## 2022-05-01 ENCOUNTER — Other Ambulatory Visit: Payer: Self-pay | Admitting: Cardiology

## 2022-05-01 DIAGNOSIS — R931 Abnormal findings on diagnostic imaging of heart and coronary circulation: Secondary | ICD-10-CM

## 2022-05-01 MED ORDER — ATORVASTATIN CALCIUM 40 MG PO TABS: 40.0000 mg | ORAL_TABLET | Freq: Every day | ORAL | 3 refills | Status: DC

## 2022-05-01 NOTE — Telephone Encounter (Signed)
Patient is requesting his CT test results please advise

## 2022-05-16 ENCOUNTER — Telehealth: Payer: Self-pay | Admitting: Neurology

## 2022-05-16 NOTE — Telephone Encounter (Signed)
Pt called and scheduled a f/u but is needing his supplies before his Feb date. Pt would like to know if an Rx can be sent for his supplies now that he is on the books and also would like to know if he is able to see an NP or does he need to only see MD since it has been a while. Please advise.

## 2022-05-16 NOTE — Telephone Encounter (Signed)
Called pt. Explained that since his last visit was 2021, he will need to be seen first before we can provide an rx for cpap supplies. Advised he has to be seen at least once a yr.  I r/s for sooner appt 05/21/22 at 2:45pm with JM,NP. Pt states he is doing well, stable. He will make sure to bring machine/power cord to appt. He verbalized understanding.

## 2022-05-20 NOTE — Progress Notes (Unsigned)
[facility] [address] [place] [place] [address] [phone]       OFFICE FOLLOW UP NOTE  Mr. Gregory Perez Date of Birth:  09/14/1940 Medical Record Number:  [id number]    Neurologist: Dr. Vickey Huger Reason for visit: CPAP follow-up    SUBJECTIVE:   CHIEF COMPLAINT:  No chief complaint on file.   HPI:   Update 05/21/2022 JM: Patient returns for CPAP follow-up in need of new CPAP supplies.  Previously seen by Dr. Vickey Huger 04/2020. Review of DL report as below showing excellent usage and optimal residual AHI.            ROS:   14 system review of systems performed and negative with exception of those listed in HPI  PMH:  Past Medical History:  Diagnosis Date   HLD (hyperlipidemia)    Hypertension    Hypokalemia    Major depression     PSH: No past surgical history on file.  Social History:  Social History   Socioeconomic History   Marital status: Married    Spouse name: Not on file   Number of children: 2   Years of education: Not on file   Highest education level: Not on file  Occupational History   Not on file  Tobacco Use   Smoking status: Former    Packs/day: 3.00    Years: 10.00    Total pack years: 30.00    Types: Cigarettes    Quit date: 1980    Years since quitting: 43.8   Smokeless tobacco: Never  Vaping Use   Vaping Use: Never used  Substance and Sexual Activity   Alcohol use: Yes    Alcohol/week: 1.0 standard drink of alcohol    Types: 1 Cans of beer per week    Comment: occasionally   Drug use: Not Currently   Sexual activity: Not on file  Other Topics Concern   Not on file  Social History Narrative   Not on file   Social Determinants of Health   Financial Resource Strain: Not on file  Food Insecurity: Not on file  Transportation Needs: Not on file  Physical Activity: Not on file  Stress: Not on file  Social Connections: Not on file  Intimate Partner Violence: Not on file    Family  History: No family history on file.  Medications:   Current Outpatient Medications on File Prior to Visit  Medication Sig Dispense Refill   amLODipine (NORVASC) 10 MG tablet      atorvastatin (LIPITOR) 40 MG tablet Take 1 tablet (40 mg total) by mouth daily. 90 tablet 3   buPROPion (WELLBUTRIN XL) 150 MG 24 hr tablet Take 150 mg by mouth at bedtime.     CHLORTHALIDONE PO Take 12.5 mg by mouth daily.     Cholecalciferol (VITAMIN D3) 25 MCG (1000 UT) CAPS Take by mouth.     cyanocobalamin 1000 MCG tablet Take 1,000 mcg by mouth daily.     finasteride (PROSCAR) 5 MG tablet Take by mouth.     FLUoxetine (PROZAC) 20 MG capsule TAKE 1 CAPSULE BY MOUTH EVERY DAY WITH 40 MG for 90     FLUoxetine (PROZAC) 40 MG capsule Take by mouth.     losartan (COZAAR) 100 MG tablet Take 100 mg by mouth daily.     potassium chloride (KLOR-CON) 10 MEQ tablet Take 10 mEq by mouth daily.     No current facility-administered medications on file prior to visit.    Allergies:  Allergies  Allergen Reactions   No Known Allergies       OBJECTIVE:  Physical Exam  There were no vitals filed for this visit. There is no height or weight on file to calculate BMI. No results found.   General: well developed, well nourished, seated, in no evident distress Head: head normocephalic and atraumatic.   Neck: supple with no carotid or supraclavicular bruits Cardiovascular: regular rate and rhythm, no murmurs Musculoskeletal: no deformity Skin:  no rash/petichiae Vascular:  Normal pulses all extremities   Neurologic Exam Mental Status: Awake and fully alert. Oriented to place and time. Recent and remote memory intact. Attention span, concentration and fund of knowledge appropriate. Mood and affect appropriate.  Cranial Nerves: Pupils equal, briskly reactive to light. Extraocular movements full without nystagmus. Visual fields full to confrontation. Hearing intact. Facial sensation intact. Face, tongue, palate  moves normally and symmetrically.  Motor: Normal bulk and tone. Normal strength in all tested extremity muscles Sensory.: intact to touch , pinprick , position and vibratory sensation.  Coordination: Rapid alternating movements normal in all extremities. Finger-to-nose and heel-to-shin performed accurately bilaterally. Gait and Station: Arises from chair without difficulty. Stance is normal. Gait demonstrates normal stride length and balance without use of AD. Tandem walk and heel toe without difficulty.  Reflexes: 1+ and symmetric. Toes downgoing.         ASSESSMENT/PLAN: [name] is a 81 y.o. year old male    [facility] on CPAP : Set up date 02/03/2020.  Compliance report shows satisfactory usage with optimal residual AHI.  Discussed continued nightly usage with ensuring greater than 4 hours nightly for optimal benefit and per insurance purposes.  Continue to follow with DME company for any needed supplies or CPAP related concerns     Follow up in *** or call earlier if needed   CC:  PCP: [name], DO    I spent *** minutes of face-to-face and non-face-to-face time with patient.  This included previsit chart review, lab review, study review, order entry, electronic health record documentation, patient education regarding diagnosis of sleep apnea with review and discussion of compliance report and answered all other questions to patient's satisfaction   Ihor Austin, [facility]  [facility] [address] [place], [place] [address]  Phone [phone] Fax [phone] Note: This document was prepared with digital dictation and possible smart phrase technology. Any transcriptional errors that result from this process are unintentional.

## 2022-05-20 NOTE — Telephone Encounter (Signed)
From patient.

## 2022-05-21 ENCOUNTER — Encounter: Payer: Self-pay | Admitting: Adult Health

## 2022-05-21 ENCOUNTER — Ambulatory Visit (INDEPENDENT_AMBULATORY_CARE_PROVIDER_SITE_OTHER): Payer: Medicare Other | Admitting: Adult Health

## 2022-05-21 ENCOUNTER — Other Ambulatory Visit: Payer: Self-pay

## 2022-05-21 VITALS — BP 150/84 | HR 64 | Ht 69.0 in | Wt 193.0 lb

## 2022-05-21 DIAGNOSIS — G4733 Obstructive sleep apnea (adult) (pediatric): Secondary | ICD-10-CM

## 2022-05-21 DIAGNOSIS — R002 Palpitations: Secondary | ICD-10-CM

## 2022-05-21 NOTE — Telephone Encounter (Signed)
Pt will call to set up 2 week monitor

## 2022-05-21 NOTE — Telephone Encounter (Signed)
From patient.

## 2022-05-21 NOTE — Progress Notes (Signed)
Community message sent to DME on file [facility] for CPAP Orders. New orders have been placed for the above pt, DOB: 05/04/1941 Thanks

## 2022-05-22 ENCOUNTER — Ambulatory Visit: Payer: Medicare Other

## 2022-05-22 DIAGNOSIS — R002 Palpitations: Secondary | ICD-10-CM

## 2022-06-06 NOTE — Telephone Encounter (Signed)
From pt

## 2022-06-12 NOTE — Telephone Encounter (Signed)
F/u w/me in 1-2 weeks. Thanks MJP

## 2022-06-19 ENCOUNTER — Encounter: Payer: Self-pay | Admitting: Cardiology

## 2022-06-19 ENCOUNTER — Other Ambulatory Visit: Payer: Self-pay

## 2022-06-19 ENCOUNTER — Ambulatory Visit: Payer: Medicare Other | Admitting: Cardiology

## 2022-06-19 VITALS — BP 153/84 | HR 66 | Ht 69.0 in | Wt 191.0 lb

## 2022-06-19 DIAGNOSIS — R931 Abnormal findings on diagnostic imaging of heart and coronary circulation: Secondary | ICD-10-CM

## 2022-06-19 DIAGNOSIS — I471 Supraventricular tachycardia, unspecified: Secondary | ICD-10-CM | POA: Insufficient documentation

## 2022-06-19 DIAGNOSIS — I10 Essential (primary) hypertension: Secondary | ICD-10-CM

## 2022-06-19 MED ORDER — METOPROLOL SUCCINATE ER 25 MG PO TB24: 25.0000 mg | ORAL_TABLET | Freq: Every day | ORAL | 3 refills | Status: DC

## 2022-06-19 NOTE — Progress Notes (Signed)
Patient referred by Gregory Margarita, DO for palpitations  Subjective:   Gregory Perez, male    DOB: 1940-11-27, 81 y.o.   MRN: [id number]   Chief Complaint  Patient presents with   Palpitations   Hypertension   Follow-up   Results      HPI  81 y.o. Caucasian male with hypertension, hyperlipidemia, elevated coronary calcium score, PSVT  Patients palpitations symptoms have improved. Reviewed recent test results with the patient, details below. He is hoping to minimize his medications.     Current Outpatient Medications:    amLODipine (NORVASC) 10 MG tablet, , Disp: , Rfl:    atorvastatin (LIPITOR) 40 MG tablet, Take 1 tablet (40 mg total) by mouth daily., Disp: 90 tablet, Rfl: 3   buPROPion (WELLBUTRIN XL) 150 MG 24 hr tablet, Take 150 mg by mouth at bedtime., Disp: , Rfl:    cefdinir (OMNICEF) 300 MG capsule, Take 300 mg by mouth daily., Disp: , Rfl:    CHLORTHALIDONE PO, Take 12.5 mg by mouth daily., Disp: , Rfl:    Cholecalciferol (VITAMIN D3) 25 MCG (1000 UT) CAPS, Take by mouth., Disp: , Rfl:    cyanocobalamin 1000 MCG tablet, Take 1,000 mcg by mouth daily., Disp: , Rfl:    finasteride (PROSCAR) 5 MG tablet, Take by mouth., Disp: , Rfl:    FLUoxetine (PROZAC) 20 MG capsule, TAKE 1 CAPSULE BY MOUTH EVERY DAY WITH 40 MG for 90, Disp: , Rfl:    FLUoxetine (PROZAC) 40 MG capsule, Take by mouth., Disp: , Rfl:    KLOR-CON M20 20 MEQ tablet, Take 10-20 mEq by mouth 2 (two) times daily. Take 20 meq in the am and 10 meq in afternoon, Disp: , Rfl:    losartan (COZAAR) 100 MG tablet, Take 100 mg by mouth daily., Disp: , Rfl:     Cardiovascular and other pertinent studies:  Mobile cardiac telemetry 13 days 05/22/2022 - 06/05/2022: Dominant rhythm: Sinus. HR 44-137 bpm. Avg HR 64 bpm, in sinus rhythm. 42 episodes of SVT, fastest at 185 bpm for 15 beats, longest for 17 beats at 102 bpm. <1% isolated SVE, couplet/triplets. 0 episodes of VT. <1% isolated VE, couplets. No  atrial fibrillation/atrial flutter/VT/high grade AV block, sinus pause >3sec noted. 19 patient triggered events, correlated with SVE/SVT  CT cardiac scoring 04/19/2022: Left Main: 20.4 LAD: 84.6 LCx: 0 RCA: 78.7   Total Agatston Score: 184 MESA database percentile: Thirty-third Aortic atherosclerosis  AORTA MEASUREMENTS:   Ascending Aorta: 38 mm Descending Aorta: 34 mm  EKG 02/13/2022: Sinus rhythm 52 bpm Cannot exclude old anteroseptal infarct Nonspecific T-abnormality  Exercise treadmill stress test 05/14/2021: Exercise treadmill stress test performed using Bruce protocol.  Patient reached 7 METS, and 80% of age predicted maximum heart rate.  Exercise capacity was low. No chest pain reported.  Dyspnea reported.  Normal heart rate and hemodynamic response. Stress EKG at 80% MPHR showed sinus tachycardia, T wave inversion in leads II, III, aVF, return to baseline within 2 min into recovery. These changes are equivocal for ischemic at suboptimal exercise. No arrythmia seen.  Recommend clinical correlation.     Recent labs: 01/01/2022: Glucose 103, BUN/Cr 19/NA. EGFR 71.  04/18/2021: Glucose 96, BUN/Cr 29/1.0. EGFR 64. HbA1C 5.4% Chol 153, TG 111, HDL 40, LDL 91 TSH 0.9 normal    Review of Systems  Cardiovascular:  Positive for palpitations. Negative for chest pain, dyspnea on exertion, leg swelling and syncope.         Vitals:  06/19/22 1027  BP: (!) 153/84  Pulse: 66  SpO2: 98%     Body mass index is 28.21 kg/m. Filed Weights   06/19/22 1027  Weight: 191 lb (86.6 kg)     Objective:   Physical Exam Vitals and nursing note reviewed.  Constitutional:      General: He is not in acute distress. Neck:     Vascular: No JVD.  Cardiovascular:     Rate and Rhythm: Normal rate and regular rhythm.     Heart sounds: Normal heart sounds. No murmur heard. Pulmonary:     Effort: Pulmonary effort is normal.     Breath sounds: Normal breath sounds. No wheezing or  rales.  Musculoskeletal:     Right lower leg: No edema.     Left lower leg: No edema.         Assessment & Recommendations:    81 y.o. Caucasian male with hypertension, hyperlipidemia, elevated coronary calcium score, PSVT  PSVT: Short lasting episodes. Discussed vagal maneuvers. In addition added metoprolol succinate 25 mg daily. TO minimize and streamline medications, discontinued chlorthalidone and HCTZ.  Elevated coronary calcium score: Recommend Aspirin 81 mg daily, increase lipitor to 40 mg daily. Repeat lipid panel  Hypertension: Changes as above  Abnormal stress test: Noted on exercise treadmill stress testing. No angina symptoms at this time. Continue medical management.    F/u in 4-6 weeks     Manish J Patwardhan, MD Pager: [phone] Office: [phone]  

## 2022-07-05 LAB — BASIC METABOLIC PANEL
BUN/Creatinine Ratio: 19 (ref 10–24)
BUN: 21 mg/dL (ref 8–27)
CO2: 23 mmol/L (ref 20–29)
Calcium: 9.5 mg/dL (ref 8.6–10.2)
Chloride: 105 mmol/L (ref 96–106)
Creatinine, Ser: 1.11 mg/dL (ref 0.76–1.27)
Glucose: 82 mg/dL (ref 70–99)
Potassium: 3.6 mmol/L (ref 3.5–5.2)
Sodium: 145 mmol/L — ABNORMAL HIGH (ref 134–144)
eGFR: 66 mL/min/{1.73_m2} (ref >59)

## 2022-07-22 ENCOUNTER — Encounter: Payer: Self-pay | Admitting: Cardiology

## 2022-07-22 ENCOUNTER — Ambulatory Visit: Payer: Medicare Other | Admitting: Cardiology

## 2022-07-22 VITALS — BP 150/82 | HR 62 | Resp 16 | Ht 69.0 in | Wt 191.0 lb

## 2022-07-22 DIAGNOSIS — I471 Supraventricular tachycardia, unspecified: Secondary | ICD-10-CM

## 2022-07-22 DIAGNOSIS — R931 Abnormal findings on diagnostic imaging of heart and coronary circulation: Secondary | ICD-10-CM

## 2022-07-22 DIAGNOSIS — I10 Essential (primary) hypertension: Secondary | ICD-10-CM

## 2022-07-22 MED ORDER — METOPROLOL SUCCINATE ER 25 MG PO TB24: 25.0000 mg | ORAL_TABLET | Freq: Every day | ORAL | 3 refills | Status: DC

## 2022-07-22 NOTE — Progress Notes (Signed)
Patient referred by [name], DO for palpitations  Subjective:   Gregory Perez, male    DOB: 1940-07-20, 82 y.o.   MRN: [id number]   Chief Complaint  Patient presents with   PSVT   Results   Follow-up    Lab results   Hypertension     HPI  82 y.o. Caucasian male with hypertension, hyperlipidemia, elevated coronary calcium score, PSVT  Patients palpitations symptoms have improved. He is tolerating metoprolol succinate in spite of concurrent use of fluoxetine. For last few weeks, he has had lingering cough, with no recurrent fever. He [place] now on prednisone for the same.     Current Outpatient Medications:    amLODipine (NORVASC) 10 MG tablet, , Disp: , Rfl:    atorvastatin (LIPITOR) 40 MG tablet, Take 1 tablet (40 mg total) by mouth daily., Disp: 90 tablet, Rfl: 3   buPROPion (WELLBUTRIN XL) 150 MG 24 hr tablet, Take 150 mg by mouth at bedtime., Disp: , Rfl:    Cholecalciferol (VITAMIN D3) 25 MCG (1000 UT) CAPS, Take by mouth., Disp: , Rfl:    cyanocobalamin 1000 MCG tablet, Take 1,000 mcg by mouth daily., Disp: , Rfl:    finasteride (PROSCAR) 5 MG tablet, Take by mouth., Disp: , Rfl:    FLUoxetine (PROZAC) 20 MG capsule, TAKE 1 CAPSULE BY MOUTH EVERY DAY WITH 40 MG for 90, Disp: , Rfl:    FLUoxetine (PROZAC) 40 MG capsule, Take by mouth., Disp: , Rfl:    KLOR-CON M20 20 MEQ tablet, Take 10-20 mEq by mouth 2 (two) times daily. Take 20 meq in the am and 10 meq in afternoon, Disp: , Rfl:    losartan (COZAAR) 100 MG tablet, Take 100 mg by mouth daily., Disp: , Rfl:    metoprolol succinate (TOPROL-XL) 25 MG 24 hr tablet, Take 1 tablet (25 mg total) by mouth daily. Take with or immediately following a meal., Disp: 30 tablet, Rfl: 3    Cardiovascular and other pertinent studies:  EKG 07/22/2022: Sinus rhythm 54 bpm Possible old anteroseptal infarct  Mobile cardiac telemetry 13 days 05/22/2022 - 06/05/2022: Dominant rhythm: Sinus. HR 44-137 bpm. Avg HR 64 bpm, in sinus  rhythm. 42 episodes of SVT, fastest at 185 bpm for 15 beats, longest for 17 beats at 102 bpm. <1% isolated SVE, couplet/triplets. 0 episodes of VT. <1% isolated VE, couplets. No atrial fibrillation/atrial flutter/VT/high grade AV block, sinus pause >3sec noted. 19 patient triggered events, correlated with SVE/SVT  CT cardiac scoring 04/19/2022: Left Main: 20.4 LAD: 84.6 LCx: 0 RCA: 78.7   Total Agatston Score: 184 MESA database percentile: Thirty-third Aortic atherosclerosis  AORTA MEASUREMENTS:   Ascending Aorta: 38 mm Descending Aorta: 34 mm  EKG 02/13/2022: Sinus rhythm 52 bpm Cannot exclude old anteroseptal infarct Nonspecific T-abnormality  Exercise treadmill stress test 05/14/2021: Exercise treadmill stress test performed using Bruce protocol.  Patient reached 7 METS, and 80% of age predicted maximum heart rate.  Exercise capacity was low. No chest pain reported.  Dyspnea reported.  Normal heart rate and hemodynamic response. Stress EKG at 80% MPHR showed sinus tachycardia, T wave inversion in leads II, III, aVF, return to baseline within 2 min into recovery. These changes are equivocal for ischemic at suboptimal exercise. No arrythmia seen.  Recommend clinical correlation.     Recent labs: 07/04/2022: Glucose 81, BUN/Cr 21/1.11. eGFR 66. Na/K 145/3.6  04/18/2021: Glucose 96, BUN/Cr 29/1.0. EGFR 64. HbA1C 5.4% Chol 153, TG 111, HDL 40, LDL 91 TSH 0.9 normal  Review of Systems  Cardiovascular:  Positive for palpitations. Negative for chest pain, dyspnea on exertion, leg swelling and syncope.         Vitals:   07/22/22 1047  BP: (!) 150/82  Pulse: 62  Resp: 16  SpO2: 96%     Body mass index is 28.21 kg/m. Filed Weights   07/22/22 1047  Weight: 191 lb (86.6 kg)     Objective:   Physical Exam Vitals and nursing note reviewed.  Constitutional:      General: He is not in acute distress. Neck:     Vascular: No JVD.  Cardiovascular:     Rate  and Rhythm: Normal rate and regular rhythm.     Heart sounds: Normal heart sounds. No murmur heard. Pulmonary:     Effort: Pulmonary effort is normal.     Breath sounds: Normal breath sounds. No wheezing or rales.  Musculoskeletal:     Right lower leg: No edema.     Left lower leg: No edema.         Assessment & Recommendations:    82 y.o. Caucasian male with hypertension, hyperlipidemia, elevated coronary calcium score, PSVT  PSVT: Short lasting episodes.  Improved with metoprolol succinate 25 mg daily. Continue the same.   Elevated coronary calcium score: Given LM calcificaiton and absence of bleeding, okay to continue Aspirin 81 mg daily. Continue lipitor 40 mg daily.  Hypertension: Elevated BP today could be due to ongoing prednisone use. No change made today.   F/u in 3 months   Gregory Mormon, MD Pager: [phone] Office: [phone]

## 2022-08-15 ENCOUNTER — Encounter: Payer: Self-pay | Admitting: Cardiology

## 2022-08-15 ENCOUNTER — Ambulatory Visit: Payer: Medicare Other | Admitting: Cardiology

## 2022-08-15 NOTE — Telephone Encounter (Signed)
From patient

## 2022-08-27 ENCOUNTER — Ambulatory Visit: Payer: Medicare Other | Admitting: Neurology

## 2022-09-16 ENCOUNTER — Ambulatory Visit: Payer: Medicare Other | Admitting: Physician Assistant

## 2022-09-17 ENCOUNTER — Encounter: Payer: Self-pay | Admitting: Physician Assistant

## 2022-09-17 ENCOUNTER — Ambulatory Visit (INDEPENDENT_AMBULATORY_CARE_PROVIDER_SITE_OTHER): Payer: Medicare Other | Admitting: Physician Assistant

## 2022-09-17 DIAGNOSIS — M71022 Abscess of bursa, left elbow: Secondary | ICD-10-CM

## 2022-09-17 DIAGNOSIS — M65341 Trigger finger, right ring finger: Secondary | ICD-10-CM | POA: Diagnosis not present

## 2022-09-17 MED ORDER — LIDOCAINE HCL 1 % IJ SOLN
0.5000 mL | INTRAMUSCULAR | Status: AC | PRN
  Administered 2022-09-17: .5 mL

## 2022-09-17 MED ORDER — LIDOCAINE HCL 1 % IJ SOLN
3.0000 mL | INTRAMUSCULAR | Status: AC | PRN
  Administered 2022-09-17: 3 mL

## 2022-09-17 MED ORDER — METHYLPREDNISOLONE ACETATE 40 MG/ML IJ SUSP
40.0000 mg | INTRAMUSCULAR | Status: AC | PRN
  Administered 2022-09-17: 40 mg

## 2022-09-17 MED ORDER — METHYLPREDNISOLONE ACETATE 40 MG/ML IJ SUSP
20.0000 mg | INTRAMUSCULAR | Status: AC | PRN
  Administered 2022-09-17: 20 mg via INTRA_ARTICULAR

## 2022-09-17 NOTE — Progress Notes (Signed)
HPI: Mr.Doland is well-known to Dr. Delilah Shan service comes in today with left ring finger that is locking up on him.  He has had triggering of the left ring finger for about 4 months.  He did have 1 injection about 4 months ago at [facility] symptoms returned 2 weeks ago.  He also has swelling olecranon area of his left elbow.  He states that the elbow pain has been present for the last week.  He is nondiabetic.  Denies any fevers chills.  Review of systems: See HPI otherwise negative  Physical exam: General pleasant well-developed well-nourished male in no acute distress. Psych: Alert and oriented x 3 Left hand active triggering of the left ring finger.  Palpable painful nodule at the left A1 pulley.  No other triggering throughout the left hand.  No rashes skin lesions ulcerations to left hand. Left elbow good range of motion.  Edema and slight erythema consistent with olecranon bursitis.  Impression: Left ring trigger finger Left elbow olecranon bursitis   Procedure Note  Patient: Dakoda Gabor             Date of Birth: 1941-03-02           MRN: [id number]             Visit Date: 09/17/2022  Procedures: Visit Diagnoses:  1. Trigger finger, right ring finger   2. Olecranon bursa abscess, left     Hand/UE Inj: L ring A1 for trigger finger on 09/17/2022 5:33 PM Medications: 0.5 mL lidocaine 1 %; 40 mg methylPREDNISolone acetate 40 MG/ML Consent was given by the patient. Immediately prior to procedure a time out was called to verify the correct patient, procedure, equipment, support staff and site/side marked as required. Patient was prepped and draped in the usual sterile fashion.    Medium Joint Inj: L olecranon bursa on 09/17/2022 5:35 PM Medications: 3 mL lidocaine 1 %; 20 mg methylPREDNISolone acetate 40 MG/ML Aspirate: 10 mL blood-tinged Consent was given by the patient. Immediately prior to procedure a time out was called to verify the correct patient, procedure, equipment,  support staff and site/side marked as required. Patient was prepped and draped in the usual sterile fashion.     Plan: Left elbow was wrapped in Ace bandage she will remove this tomorrow.  Will see him back in just 2 weeks did discuss with him that it may take multiple aspirations of the elbow to resolve the olecranon bursitis.  Follow-up sooner if there is any questions concerns.  Regards to the left trigger finger would recommend no more than 3 injections prior to having him undergo a left ring finger trigger finger release.  Questions were encouraged and answered at length.  He is reminded to keep pressure off the episode.

## 2022-09-23 ENCOUNTER — Telehealth: Payer: Self-pay | Admitting: Adult Health

## 2022-09-23 NOTE — Telephone Encounter (Signed)
Called the patient back. Pt states that he has been on going for years now struggling with balance concerns. He has had a increase in falls. He has been working with a trainer who has also been helping and despite doing this the balance has not improved. The patient has also been following with cardiology. Both cardiology and trainer recommended a neurology work up. Pt has not been seen for this concern before. He has discussed previously with PCP and also has upcoming apt on Friday as he was under the impression a referral might be needed. Advised that would be correct. I have placed a apt on hold April 11 for the pt 8:30 am. Advised this is on hold pending referral. Will notify our referrals team as well to keep eye out for appointment. Pt verbalized understanding. Pt was very appreciative.

## 2022-09-23 NOTE — Telephone Encounter (Signed)
Pt called and LVM stating that he is wanting to speak to the RN and did not give any other information. Please advise.

## 2022-10-07 ENCOUNTER — Ambulatory Visit: Payer: Medicare Other | Admitting: Orthopaedic Surgery

## 2022-10-16 ENCOUNTER — Encounter: Payer: Self-pay | Admitting: Neurology

## 2022-10-16 ENCOUNTER — Ambulatory Visit (INDEPENDENT_AMBULATORY_CARE_PROVIDER_SITE_OTHER): Payer: Medicare Other | Admitting: Neurology

## 2022-10-16 VITALS — BP 169/90 | HR 54 | Ht 69.0 in | Wt 202.0 lb

## 2022-10-16 DIAGNOSIS — M48061 Spinal stenosis, lumbar region without neurogenic claudication: Secondary | ICD-10-CM | POA: Diagnosis not present

## 2022-10-16 DIAGNOSIS — M5489 Other dorsalgia: Secondary | ICD-10-CM

## 2022-10-16 DIAGNOSIS — M549 Dorsalgia, unspecified: Secondary | ICD-10-CM | POA: Insufficient documentation

## 2022-10-16 DIAGNOSIS — S76011A Strain of muscle, fascia and tendon of right hip, initial encounter: Secondary | ICD-10-CM

## 2022-10-16 NOTE — Patient Instructions (Addendum)
Neurogenic claudication to be ruled out,   Tandem gait affected,  right leg not fully extended as we walk, drifts to right side.   1) I consider a dx within the differential of a neurogenic claudication.  Not vascular - There is no radicular pain and at this point no flank pain or sciatic pain but a nonradiating right of center back pain and that the patient reports he experiences when standing for long time.  This may also be present when he walks but he felt that the back pain was not interfering with his ability to walk steady.  2) for the meantime until we have further evaluation with orthopedics and with a nerve conduction muscle study here I would recommend to use hiking poles in both hands to stabilize gait and to prevent falls.  3) I also would like to encourage the patient to continue his daily exercises and with the added safety of the hiking poles I think he can extend his range of walking safely.  My goal is to evaluate for possible spinal stenosis #1 neurogenic claudication 2 #2 #3 differentiate these from a hip related bursitis or nerve pain nerve impingement for which I can hopefully get a nerve conduction EMG study that would compare both lower extremities.  NCV/ EMG of bilateral hips, lumbar paraspinal.   Orthopedist to address hip joint.   Imaging of lumbar spine by CT or MRI non contrast   I plan to follow up either personally or through our NP within 3-5 months.
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
## 2022-10-17 ENCOUNTER — Telehealth: Payer: Self-pay | Admitting: Neurology

## 2022-10-17 NOTE — Telephone Encounter (Signed)
medicare NPR sent to [facility] for open MRI [phone]

## 2022-10-23 ENCOUNTER — Ambulatory Visit: Payer: Medicare Other | Admitting: Cardiology

## 2022-10-23 ENCOUNTER — Encounter: Payer: Self-pay | Admitting: Cardiology

## 2022-10-23 VITALS — BP 165/80 | HR 59 | Resp 17 | Ht 69.0 in | Wt 198.0 lb

## 2022-10-23 DIAGNOSIS — I471 Supraventricular tachycardia, unspecified: Secondary | ICD-10-CM

## 2022-10-23 DIAGNOSIS — R931 Abnormal findings on diagnostic imaging of heart and coronary circulation: Secondary | ICD-10-CM

## 2022-10-23 DIAGNOSIS — R0609 Other forms of dyspnea: Secondary | ICD-10-CM | POA: Insufficient documentation

## 2022-10-23 DIAGNOSIS — R9439 Abnormal result of other cardiovascular function study: Secondary | ICD-10-CM

## 2022-10-23 DIAGNOSIS — R9431 Abnormal electrocardiogram [ECG] [EKG]: Secondary | ICD-10-CM

## 2022-10-23 MED ORDER — SPIRONOLACTONE 25 MG PO TABS: 25.0000 mg | ORAL_TABLET | Freq: Every day | ORAL | 3 refills | Status: DC

## 2022-10-23 NOTE — Progress Notes (Signed)
Patient referred by [name], DO for palpitations  Subjective:   Gregory Perez, male    DOB: 11-16-1940, 82 y.o.   MRN: [id number]   Chief Complaint  Patient presents with   PSVT   Follow-up    3 month   Hypertension     HPI  82 y.o. Caucasian male with hypertension, hyperlipidemia, elevated coronary calcium score, PSVT  Recently, patient has had increasing exertional dyspnea symptoms.  His wife noticed him having improving after taking out the trash can.  With exertion, he also feels loss of balance, although denies any specific dizziness, lightheadedness symptoms.  Blood pressure is elevated today, and has been elevated recently at home as well.  Reviewed recent test results with the patient, details below.     Current Outpatient Medications:    amLODipine (NORVASC) 10 MG tablet, , Disp: , Rfl:    atorvastatin (LIPITOR) 40 MG tablet, Take 1 tablet (40 mg total) by mouth daily., Disp: 90 tablet, Rfl: 3   buPROPion (WELLBUTRIN XL) 150 MG 24 hr tablet, Take 150 mg by mouth at bedtime., Disp: , Rfl:    Cholecalciferol (VITAMIN D3) 25 MCG (1000 UT) CAPS, Take by mouth., Disp: , Rfl:    cyanocobalamin 1000 MCG tablet, Take 1,000 mcg by mouth daily., Disp: , Rfl:    finasteride (PROSCAR) 5 MG tablet, Take by mouth., Disp: , Rfl:    FLUoxetine (PROZAC) 20 MG capsule, TAKE 1 CAPSULE BY MOUTH EVERY DAY WITH 40 MG for 90, Disp: , Rfl:    FLUoxetine (PROZAC) 40 MG capsule, Take by mouth., Disp: , Rfl:    losartan (COZAAR) 100 MG tablet, Take 100 mg by mouth daily., Disp: , Rfl:    metoprolol succinate (TOPROL-XL) 25 MG 24 hr tablet, Take 1 tablet (25 mg total) by mouth daily. Take with or immediately following a meal., Disp: 90 tablet, Rfl: 3    Cardiovascular and other pertinent studies:  EKG 07/22/2022: Sinus rhythm 54 bpm Possible old anteroseptal infarct  Mobile cardiac telemetry 13 days 05/22/2022 - 06/05/2022: Dominant rhythm: Sinus. HR 44-137 bpm. Avg HR 64  bpm, in sinus rhythm. 42 episodes of SVT, fastest at 185 bpm for 15 beats, longest for 17 beats at 102 bpm. <1% isolated SVE, couplet/triplets. 0 episodes of VT. <1% isolated VE, couplets. No atrial fibrillation/atrial flutter/VT/high grade AV block, sinus pause >3sec noted. 19 patient triggered events, correlated with SVE/SVT  CT cardiac scoring 04/19/2022: Left Main: 20.4 LAD: 84.6 LCx: 0 RCA: 78.7   Total Agatston Score: 184 MESA database percentile: Thirty-third Aortic atherosclerosis  AORTA MEASUREMENTS:   Ascending Aorta: 38 mm Descending Aorta: 34 mm  EKG 02/13/2022: Sinus rhythm 52 bpm Cannot exclude old anteroseptal infarct Nonspecific T-abnormality  Exercise treadmill stress test 05/14/2021: Exercise treadmill stress test performed using Bruce protocol.  Patient reached 7 METS, and 80% of age predicted maximum heart rate.  Exercise capacity was low. No chest pain reported.  Dyspnea reported.  Normal heart rate and hemodynamic response. Stress EKG at 80% MPHR showed sinus tachycardia, T wave inversion in leads II, III, aVF, return to baseline within 2 min into recovery. These changes are equivocal for ischemic at suboptimal exercise. No arrythmia seen.  Recommend clinical correlation.     Recent labs: 09/29/2022: Glucose 94, BUN/Cr 18/0.8. EGFR 112. Na/K 140/3.4. Rest of the CMP normal H/H 15/46. MCV 90. Platelets 198 TSH 1.9 normal  07/04/2022: Glucose 81, BUN/Cr 21/1.11. eGFR 66. Na/K 145/3.6  04/18/2021: Glucose 96, BUN/Cr 29/1.0. EGFR  64. HbA1C 5.4% Chol 153, TG 111, HDL 40, LDL 91 TSH 0.9 normal    Review of Systems  Cardiovascular:  Positive for palpitations. Negative for chest pain, dyspnea on exertion, leg swelling and syncope.         Vitals:   10/23/22 1104  BP: (!) 169/89  Pulse: (!) 59  Resp: 17  SpO2: 96%     Body mass index is 29.24 kg/m. Filed Weights   10/23/22 1104  Weight: 198 lb (89.8 kg)     Objective:   Physical  Exam Vitals and nursing note reviewed.  Constitutional:      General: He is not in acute distress. Neck:     Vascular: No JVD.  Cardiovascular:     Rate and Rhythm: Normal rate and regular rhythm.     Heart sounds: Normal heart sounds. No murmur heard. Pulmonary:     Effort: Pulmonary effort is normal.     Breath sounds: Normal breath sounds. No wheezing or rales.  Musculoskeletal:     Right lower leg: No edema.     Left lower leg: No edema.         Assessment & Recommendations:    82 y.o. Caucasian male with hypertension, hyperlipidemia, elevated coronary calcium score, PSVT, exertional dyspnea  Exertional dyspnea: Symptoms concerning for angina equivalent.  I am concerned regarding obstructive coronary artery disease. 33rd percentile calcium noted on CT cardiac scoring scan in 04/2022. Recommend coronary CT angiogram for definitive coronary anatomy and physiology evaluation. Will also check echocardiogram.  PSVT: Short lasting episodes.  Improved with metoprolol succinate 25 mg daily. Continue the same.   Elevated coronary calcium score: Given LM calcificaiton and absence of bleeding, okay to continue Aspirin 81 mg daily. Continue lipitor 40 mg daily.  Hypertension: Uncontrolled.  With K of 3.4, added spironolactone 25 mg daily. Repeat BMP in 1 week.  Patient has upcoming international trips, first of which is in July.  I hope to complete the above workup in next few weeks and hopefully offer any therapy that could improve patient's symptoms before his trip.  F/u in 4 weeks     Gregory Negus, MD Pager: [phone] Office: [phone]

## 2022-10-29 ENCOUNTER — Other Ambulatory Visit: Payer: Self-pay | Admitting: Cardiology

## 2022-10-29 DIAGNOSIS — R0609 Other forms of dyspnea: Secondary | ICD-10-CM

## 2022-11-01 ENCOUNTER — Encounter: Payer: Self-pay | Admitting: Cardiology

## 2022-11-01 ENCOUNTER — Telehealth: Payer: Self-pay

## 2022-11-01 ENCOUNTER — Ambulatory Visit: Payer: Medicare Other | Admitting: Cardiology

## 2022-11-01 VITALS — BP 161/88 | HR 61 | Ht 69.0 in | Wt 201.0 lb

## 2022-11-01 DIAGNOSIS — R55 Syncope and collapse: Secondary | ICD-10-CM

## 2022-11-01 DIAGNOSIS — I471 Supraventricular tachycardia, unspecified: Secondary | ICD-10-CM

## 2022-11-01 DIAGNOSIS — R931 Abnormal findings on diagnostic imaging of heart and coronary circulation: Secondary | ICD-10-CM

## 2022-11-01 DIAGNOSIS — R0609 Other forms of dyspnea: Secondary | ICD-10-CM

## 2022-11-01 NOTE — Progress Notes (Signed)
Patient referred by [name], DO for palpitations  Subjective:   Gregory Perez, male    DOB: 02-Apr-1941, 82 y.o.   MRN: [id number]   Chief Complaint  Patient presents with  . PSVT (paroxysmal supraventricular tachycardia)  . Follow-up  . Results     HPI  82 y.o. Caucasian male with hypertension, hyperlipidemia, elevated coronary calcium score, PSVT  Recently, patient has had increasing exertional dyspnea symptoms.  His wife noticed him having improving after taking out the trash can.  With exertion, he also feels loss of balance, although denies any specific dizziness, lightheadedness symptoms.  Blood pressure is elevated today, and has been elevated recently at home as well.  Reviewed recent test results with the patient, details below.     Current Outpatient Medications:  .  amLODipine (NORVASC) 10 MG tablet, , Disp: , Rfl:  .  atorvastatin (LIPITOR) 40 MG tablet, Take 1 tablet (40 mg total) by mouth daily., Disp: 90 tablet, Rfl: 3 .  buPROPion (WELLBUTRIN XL) 150 MG 24 hr tablet, Take 150 mg by mouth at bedtime., Disp: , Rfl:  .  Cholecalciferol (VITAMIN D3) 25 MCG (1000 UT) CAPS, Take by mouth., Disp: , Rfl:  .  cyanocobalamin 1000 MCG tablet, Take 1,000 mcg by mouth daily., Disp: , Rfl:  .  finasteride (PROSCAR) 5 MG tablet, Take by mouth., Disp: , Rfl:  .  FLUoxetine (PROZAC) 20 MG capsule, TAKE 1 CAPSULE BY MOUTH EVERY DAY WITH 40 MG for 90, Disp: , Rfl:  .  FLUoxetine (PROZAC) 40 MG capsule, Take by mouth., Disp: , Rfl:  .  losartan (COZAAR) 100 MG tablet, Take 100 mg by mouth daily., Disp: , Rfl:  .  metoprolol succinate (TOPROL-XL) 25 MG 24 hr tablet, Take 1 tablet (25 mg total) by mouth daily. Take with or immediately following a meal., Disp: 90 tablet, Rfl: 3 .  spironolactone (ALDACTONE) 25 MG tablet, Take 1 tablet (25 mg total) by mouth daily., Disp: 30 tablet, Rfl: 3    Cardiovascular and other pertinent studies:  EKG 11/01/2022: Sinus rhythm 51  bpm Possible old anteroseptla infarct No significant change compared to precious EKG  EKG 07/22/2022: Sinus rhythm 54 bpm Possible old anteroseptal infarct  Mobile cardiac telemetry 13 days 05/22/2022 - 06/05/2022: Dominant rhythm: Sinus. HR 44-137 bpm. Avg HR 64 bpm, in sinus rhythm. 42 episodes of SVT, fastest at 185 bpm for 15 beats, longest for 17 beats at 102 bpm. <1% isolated SVE, couplet/triplets. 0 episodes of VT. <1% isolated VE, couplets. No atrial fibrillation/atrial flutter/VT/high grade AV block, sinus pause >3sec noted. 19 patient triggered events, correlated with SVE/SVT  CT cardiac scoring 04/19/2022: Left Main: 20.4 LAD: 84.6 LCx: 0 RCA: 78.7   Total Agatston Score: 184 MESA database percentile: Thirty-third Aortic atherosclerosis  AORTA MEASUREMENTS:   Ascending Aorta: 38 mm Descending Aorta: 34 mm  EKG 02/13/2022: Sinus rhythm 52 bpm Cannot exclude old anteroseptal infarct Nonspecific T-abnormality  Exercise treadmill stress test 05/14/2021: Exercise treadmill stress test performed using Bruce protocol.  Patient reached 7 METS, and 80% of age predicted maximum heart rate.  Exercise capacity was low. No chest pain reported.  Dyspnea reported.  Normal heart rate and hemodynamic response. Stress EKG at 80% MPHR showed sinus tachycardia, T wave inversion in leads II, III, aVF, return to baseline within 2 min into recovery. These changes are equivocal for ischemic at suboptimal exercise. No arrythmia seen.  Recommend clinical correlation.     Recent labs: 09/29/2022: Glucose 94,  BUN/Cr 18/0.8. EGFR 112. Na/K 140/3.4. Rest of the CMP normal H/H 15/46. MCV 90. Platelets 198 TSH 1.9 normal  07/04/2022: Glucose 81, BUN/Cr 21/1.11. eGFR 66. Na/K 145/3.6  04/18/2021: Glucose 96, BUN/Cr 29/1.0. EGFR 64. HbA1C 5.4% Chol 153, TG 111, HDL 40, LDL 91 TSH 0.9 normal    Review of Systems  Cardiovascular:  Positive for palpitations. Negative for chest pain,  dyspnea on exertion, leg swelling and syncope.         Vitals:   11/01/22 1216  BP: (!) 161/88  Pulse: 61  SpO2: 97%   Orthostatic VS for the past 72 hrs (Last 3 readings):  Orthostatic BP Patient Position BP Location Cuff Size Orthostatic Pulse  11/01/22 1226 143/75 Standing Left Arm Normal 63  11/01/22 1225 144/82 Sitting Left Arm Normal 62  11/01/22 1224 160/81 Supine Left Arm Normal 52     Body mass index is 29.68 kg/m. Filed Weights   11/01/22 1216  Weight: 201 lb (91.2 kg)     Objective:   Physical Exam Vitals and nursing note reviewed.  Constitutional:      General: He is not in acute distress. Neck:     Vascular: No JVD.  Cardiovascular:     Rate and Rhythm: Normal rate and regular rhythm.     Heart sounds: Normal heart sounds. No murmur heard. Pulmonary:     Effort: Pulmonary effort is normal.     Breath sounds: Normal breath sounds. No wheezing or rales.  Musculoskeletal:     Right lower leg: No edema.     Left lower leg: No edema.        Assessment & Recommendations:    82 y.o. Caucasian male with hypertension, hyperlipidemia, elevated coronary calcium score, PSVT, exertional dyspnea  Exertional dyspnea: Symptoms concerning for angina equivalent.  I am concerned regarding obstructive coronary artery disease. 33rd percentile calcium noted on CT cardiac scoring scan in 04/2022. Recommend coronary CT angiogram for definitive coronary anatomy and physiology evaluation. Will also check echocardiogram.  PSVT: Short lasting episodes.  Improved with metoprolol succinate 25 mg daily. Continue the same.   Elevated coronary calcium score: Given LM calcificaiton and absence of bleeding, okay to continue Aspirin 81 mg daily. Continue lipitor 40 mg daily.  Hypertension: Uncontrolled.  With K of 3.4, added spironolactone 25 mg daily. Repeat BMP in 1 week.  Patient has upcoming international trips, first of which is in July.  I hope to complete the  above workup in next few weeks and hopefully offer any therapy that could improve patient's symptoms before his trip.  F/u in 4 weeks     Elder Negus, MD Pager: [phone] Office: [phone]

## 2022-11-01 NOTE — Telephone Encounter (Signed)
From patient.   I also documented in his chart but accidentally saved it instead of sending it to you.

## 2022-11-01 NOTE — Telephone Encounter (Signed)
Will see him today

## 2022-11-01 NOTE — Telephone Encounter (Signed)
Patient called and stated that yesterday he went for a walk and was about a block from his house, and then all of sudden he was disoriented and "blacked out" and fell and hit his head on tree near him. Would you like for him to come in today to see you? Please advise.

## 2022-11-02 ENCOUNTER — Encounter: Payer: Self-pay | Admitting: Cardiology

## 2022-11-02 DIAGNOSIS — R55 Syncope and collapse: Secondary | ICD-10-CM | POA: Insufficient documentation

## 2022-11-04 ENCOUNTER — Telehealth: Payer: Self-pay | Admitting: Neurology

## 2022-11-04 ENCOUNTER — Encounter: Payer: Self-pay | Admitting: Neurology

## 2022-11-04 DIAGNOSIS — S76011A Strain of muscle, fascia and tendon of right hip, initial encounter: Secondary | ICD-10-CM

## 2022-11-04 DIAGNOSIS — M5489 Other dorsalgia: Secondary | ICD-10-CM

## 2022-11-04 DIAGNOSIS — R296 Repeated falls: Secondary | ICD-10-CM

## 2022-11-04 DIAGNOSIS — M48061 Spinal stenosis, lumbar region without neurogenic claudication: Secondary | ICD-10-CM

## 2022-11-04 NOTE — Telephone Encounter (Signed)
From patient.

## 2022-11-04 NOTE — Telephone Encounter (Signed)
Called the patient back. Last Thursday was walking felt wobbly and then the next thing he knew he was waking up and had hit a tree. He doesn't think he was out long. Cardiologist has evaluated and was concerned for syncopial events. They have ordered several cardiac work up test. Patient completed the MRI lumbar spine on 10/31/22 through [facility]. Advised the patient I do not see those results and asked that he contact them about sending [place] the report. He questioned if the NCV/EMG is necessary. I advised based off the notes the reasoning why Dr Vickey Huger is ordering but if the cardiac work up comes back in the meantime indicating alternative reason can always consider if NCV/EMG still needed as we get closer to his apt scheduled in July.  Pt verbalized understanding.

## 2022-11-04 NOTE — Telephone Encounter (Signed)
Pt states that on the day he had his MRI, later he was out walking, he loss balance believes he fell forward and some how fell and was found by 2 neighbors at the base of a tree.  Pt says his face is scarred in 2 places, pt asking for a call to discuss.

## 2022-11-05 ENCOUNTER — Ambulatory Visit: Payer: Medicare Other

## 2022-11-05 ENCOUNTER — Other Ambulatory Visit: Payer: Medicare Other

## 2022-11-05 DIAGNOSIS — R55 Syncope and collapse: Secondary | ICD-10-CM

## 2022-11-05 NOTE — Telephone Encounter (Signed)
Spoke with the patient and discussed the rationale behind upcoming tests. You do not need to call.  Thanks MJP

## 2022-11-06 ENCOUNTER — Other Ambulatory Visit: Payer: Self-pay | Admitting: Cardiology

## 2022-11-07 ENCOUNTER — Other Ambulatory Visit: Payer: Self-pay | Admitting: Internal Medicine

## 2022-11-07 DIAGNOSIS — N281 Cyst of kidney, acquired: Secondary | ICD-10-CM

## 2022-11-09 LAB — BASIC METABOLIC PANEL
BUN/Creatinine Ratio: 24 (ref 10–24)
BUN: 27 mg/dL (ref 8–27)
CO2: 21 mmol/L (ref 20–29)
Calcium: 9.8 mg/dL (ref 8.6–10.2)
Chloride: 104 mmol/L (ref 96–106)
Creatinine, Ser: 1.14 mg/dL (ref 0.76–1.27)
Glucose: 110 mg/dL — ABNORMAL HIGH (ref 70–99)
Potassium: 4.3 mmol/L (ref 3.5–5.2)
Sodium: 142 mmol/L (ref 134–144)
eGFR: 64 mL/min/{1.73_m2} (ref >59)

## 2022-11-11 ENCOUNTER — Encounter: Payer: Self-pay | Admitting: Physical Therapy

## 2022-11-11 ENCOUNTER — Ambulatory Visit: Payer: Medicare Other | Attending: Neurology | Admitting: Physical Therapy

## 2022-11-11 ENCOUNTER — Telehealth (HOSPITAL_COMMUNITY): Payer: Self-pay | Admitting: *Deleted

## 2022-11-11 VITALS — BP 118/67 | HR 60

## 2022-11-11 DIAGNOSIS — M5489 Other dorsalgia: Secondary | ICD-10-CM | POA: Insufficient documentation

## 2022-11-11 DIAGNOSIS — R2681 Unsteadiness on feet: Secondary | ICD-10-CM | POA: Diagnosis present

## 2022-11-11 DIAGNOSIS — S76011A Strain of muscle, fascia and tendon of right hip, initial encounter: Secondary | ICD-10-CM | POA: Insufficient documentation

## 2022-11-11 DIAGNOSIS — R269 Unspecified abnormalities of gait and mobility: Secondary | ICD-10-CM | POA: Insufficient documentation

## 2022-11-11 DIAGNOSIS — R296 Repeated falls: Secondary | ICD-10-CM | POA: Diagnosis not present

## 2022-11-11 DIAGNOSIS — R931 Abnormal findings on diagnostic imaging of heart and coronary circulation: Secondary | ICD-10-CM | POA: Diagnosis not present

## 2022-11-11 DIAGNOSIS — M48061 Spinal stenosis, lumbar region without neurogenic claudication: Secondary | ICD-10-CM | POA: Diagnosis not present

## 2022-11-11 NOTE — Telephone Encounter (Signed)
Reaching out to patient to offer assistance regarding upcoming cardiac imaging study; pt verbalizes understanding of appt date/time, parking situation and where to check in, pre-test NPO status, and verified current allergies; name and call back number provided for further questions should they arise  Hartford Maulden RN Navigator Cardiac Imaging [facility] [phone] office [phone] cell  Patient to take his daily medications and is aware to arrive at 9:30am. 

## 2022-11-11 NOTE — Telephone Encounter (Signed)
Attempted to call patient regarding upcoming cardiac CT appointment. °Left message on voicemail with name and callback number ° °Quinntin Malter RN Navigator Cardiac Imaging °[facility] °[phone] Office °[phone] Cell ° °

## 2022-11-11 NOTE — Therapy (Signed)
OUTPATIENT PHYSICAL THERAPY NEURO EVALUATION   Patient Name: Gregory Perez MRN: [id number] DOB:1941-04-15, 82 y.o., male Today's Date: 11/11/2022   PCP: [name], DO REFERRING PROVIDER: Dohmeier, Porfirio Mylar, MD  END OF SESSION:  PT End of Session - 11/11/22 1233     Visit Number 1    Number of Visits 10   including eval   Date for PT Re-Evaluation 01/06/23    Authorization Type Medicare and [employer] Secondary    PT Start Time 1230    PT Stop Time 1318    PT Time Calculation (min) 48 min    Equipment Utilized During Treatment Gait belt    Activity Tolerance Patient tolerated treatment well    Behavior During Therapy WFL for tasks assessed/performed             Past Medical History:  Diagnosis Date   HLD (hyperlipidemia)    Hypertension    Hypokalemia    Major depression    History reviewed. No pertinent surgical history. Patient Active Problem List   Diagnosis Date Noted   Syncope and collapse 11/02/2022   Abnormal electrocardiogram 10/23/2022   DOE (dyspnea on exertion) 10/23/2022   Pain in right paraspinal region 10/16/2022   Spinal stenosis of lumbar region without neurogenic claudication 10/16/2022   Strain of right hip adductor muscle 10/16/2022   PSVT (paroxysmal supraventricular tachycardia) 06/19/2022   Agatston coronary artery calcium score between 100 and 400 06/19/2022   Essential hypertension 02/13/2022   Hypertensive heart disease without heart failure 02/13/2022   Abnormal stress ECG with treadmill 08/16/2021   Palpitations 05/04/2021   Encounter for counseling on use of bilevel positive airway pressure (BiPAP) therapy 04/12/2020   Controlled REM sleep behavior disorder 04/12/2020   Chronic intermittent hypoxia with obstructive sleep apnea 01/10/2020   REM sleep behavior disorder 12/16/2019   Abnormal dreams 12/16/2019    ONSET DATE: 11/06/2022 (referral date)  REFERRING DIAG: M54.89 (ICD-10-CM) - Pain in right paraspinal region  M48.061 (ICD-10-CM) - Spinal stenosis of lumbar region without neurogenic claudication S76.011A (ICD-10-CM) - Strain of right hip adductor muscle, initial encounter R29.6 (ICD-10-CM) - Frequent falls  THERAPY DIAG:  Abnormality of gait and mobility - Plan: PT plan of care cert/re-cert  Unsteadiness on feet - Plan: PT plan of care cert/re-cert  Rationale for Evaluation and Treatment: Rehabilitation  SUBJECTIVE:                                                                                                                                                                                             SUBJECTIVE STATEMENT: Patient reports that over the last few years  he has noticed changes in his balance where he frequently feels like he is falling forward. Patient reports a history of 3 falls within the last 2 years. The first fall occurred when the patient was carrying a bucket of water and went to empty the bucket and fell due to the momentum. Patient reports another fall when pushing out garbage can to the yard. Patient reports that also about 1 week ago, he went on a walk around the neighborhood and had a cane with him. He started to feel very off balance and fell forward and hit a side of tree. Patient had at least a few seconds of black out in which he does not recall falling but states he know he did as he was found down by neighbors. Patient reports that when he starts walking he gets tired. Patient is wanting to go on a trip with his grandson to [place] and is wanting to do everything he can to work on his balance enough to safely get to this point. Patient is also being followed by neurology and cardiology especially given this most recent episode with concerns for possible syncope. Patient states his baseline oxygen level at home at about 95%.    Pt accompanied by: self  PERTINENT HISTORY: hypertension, hyperlipidemia, elevated coronary calcium score, PSVT  PAIN:  Are you having  pain? No  PRECAUTIONS: Fall - recent syncope episode (monitor vitals)  WEIGHT BEARING RESTRICTIONS: No  FALLS: Has patient fallen in last 6 months? Yes. 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 Multilevel spinal and foraminal encroachment throughout the cervical spine as described above.   Patchy hyperintensity in the pons bilaterally most likely chronic microvascular ischemic change.  EKG 11/01/2022: Sinus rhythm 51 bpm Possible old anteroseptla infarct No significant change compared to precious EKG   EKG 07/22/2022: Sinus rhythm 54 bpm Possible old anteroseptal infarct  COGNITION: Overall cognitive status: Within functional limits for tasks assessed - reports mild word recall difficulty and minor short term changes but nothing major   SENSATION: WFL  COORDINATION:  WFL  EDEMA:  Abscent  MUSCLE TONE: Grossly WFL bilateral LE bilaterally  POSTURE: rounded shoulders and forward head  LOWER EXTREMITY ROM:    Mild stiffness throughout hip flexors, hamstrings, and quads but WFL for activities performed during session   LOWER EXTREMITY MMT:    Strength Right Eval Left Eval  Hip flexion 4/5 4/5  Hip extension    Hip abduction    Hip adduction    Hip internal rotation    Hip external rotation    Knee flexion 4/5 4/5  Knee extension 4/5 4/5  Ankle dorsiflexion    Ankle plantarflexion    Ankle inversion    Ankle eversion     (Blank rows = not tested)  BED MOBILITY:  Sit to  supine Complete Independence Supine to sit Complete Independence Rolling to Right Complete Independence Rolling to Left Complete Independence  TRANSFERS: Assistive device utilized: None  Sit to stand: SBA Stand  to sit: SBA Chair to chair: SBA  GAIT: Gait pattern:  forward lean, possibly reduced arm swing-will continue to monitor, poor foot clearance- Right, and poor foot clearance- Left Distance walked: 2 x 60' Assistive device utilized: None Level of assistance: SBA  FUNCTIONAL TESTS:    [facility] PT Assessment - 11/11/22 0001       Standardized Balance Assessment   Standardized Balance Assessment Five Times Sit to Stand;10 meter walk test    Five times sit to stand comments  13.74   sec without UE use   10 Meter Walk 0.93   m/s without AD (SBA)           PATIENT SURVEYS:  ABC scale 88.8% Functional  TODAY'S TREATMENT:                                                                                                                              Initial Eval only  PATIENT EDUCATION: Education details: POC, examination findings, results, goal collaboration Person educated: Patient Education method: Explanation Education comprehension: verbalized understanding and needs further education  HOME EXERCISE PROGRAM: To be provided  GOALS: Goals reviewed with patient? Yes  SHORT TERM GOALS: Target date: 12/02/2022  Patient will demonstrate 100% compliance with initial HEP to continue to progress between physical therapy sessions.   Baseline: To be provided Goal status: INITIAL  2. Patient will improve their 5x Sit to Stand score to less than 12 seconds to demonstrate a decreased risk for falls and improved LE strength.   Baseline: 13.74" Goal status: INITIAL  3.  Therapist will assess miniBEST and write goal as indicated to help target balance interventions for patient's internation trip.  Baseline: To be assessed Goal status: INITIAL  4.  Therapist will assess and  write goal as indicated to help target balance interventions for patient's internation trip.  Baseline: To be assessed Goal status: INITIAL  LONG TERM GOALS: Target date: 12/23/2022  Patient will report demonstrate independence with final HEP in order to maintain current gains and continue to progress after physical therapy discharge.   Baseline: To be provided Goal status: INITIAL  2.  Patient will improve gait speed to 1.1 m/s or greater to indicate a reduced risk for falls.   Baseline: 0.93 m/s without AD  (SBA) Goal status: INITIAL  3.  to be assessed / goal written as indicated Baseline: To be assessed Goal status: INITIAL  4.  miniBEST to be assessed/goal written as indicated Baseline: To be assessed Goal status: INITIAL   ASSESSMENT:  CLINICAL IMPRESSION: Patient is a 82 y.o. male who was seen today for physical therapy evaluation and treatment for imbalance with PMH of hypertension, hyperlipidemia, elevated coronary calcium score, and PSVT. Patient was also referred with reports of back pain and adductor strain; however, patient denies pain and states these areas are not of concern. Patient presents with impairments including an increased risk for falls as indicated by 5xSTS and  gait speed. Patient also undergoing cardiopulmonary testing and will benefit from close monitoring/collaboration with cardiologist in order to direct POC. Patient also demonstrates mild impairments in strength and abnormal gait including tendency to demonstrate increased forefoot contact and reduced arm swing. Recommend testing miniBEST and next session to further guide POC. Patient will benefit from skilled physical therapy services in order to help him meet his goal of being able to travel in [place] with his grandson.   OBJECTIVE IMPAIRMENTS: Abnormal gait, decreased activity tolerance, decreased balance, decreased endurance, decreased mobility, difficulty walking, and decreased strength.    ACTIVITY LIMITATIONS: carrying, standing, squatting, and locomotion level  PARTICIPATION LIMITATIONS: occupation, yard work, and travel  PERSONAL FACTORS: Age and 3+ comorbidities: see above  are also affecting patient's functional outcome.   REHAB POTENTIAL: Good  CLINICAL DECISION MAKING: Stable/uncomplicated  EVALUATION COMPLEXITY: Low  PLAN:  PT FREQUENCY: 1-2x/week  PT DURATION: 6 weeks  PLANNED INTERVENTIONS: Therapeutic exercises, Therapeutic activity, Neuromuscular re-education, Balance training, Gait training, Patient/Family education, Self Care, Manual therapy, and Re-evaluation  PLAN FOR NEXT SESSION: miniBEST / write goal, while monitoring vitals assess/write goal - initial HEP, follow up about cardiology results/testing  Carmelia Bake, PT, DPT 11/11/2022, 3:29 PM

## 2022-11-12 ENCOUNTER — Ambulatory Visit (HOSPITAL_COMMUNITY)
Admission: RE | Admit: 2022-11-12 | Discharge: 2022-11-12 | Disposition: A | Discharge status: Home / Self Care | Payer: Medicare Other | Source: Ambulatory Visit | Attending: Internal Medicine | Admitting: Internal Medicine

## 2022-11-12 DIAGNOSIS — R0609 Other forms of dyspnea: Secondary | ICD-10-CM

## 2022-11-12 DIAGNOSIS — I700 Atherosclerosis of aorta: Secondary | ICD-10-CM | POA: Diagnosis not present

## 2022-11-12 DIAGNOSIS — R9439 Abnormal result of other cardiovascular function study: Secondary | ICD-10-CM

## 2022-11-12 DIAGNOSIS — R9431 Abnormal electrocardiogram [ECG] [EKG]: Secondary | ICD-10-CM | POA: Diagnosis present

## 2022-11-12 DIAGNOSIS — R931 Abnormal findings on diagnostic imaging of heart and coronary circulation: Secondary | ICD-10-CM | POA: Diagnosis present

## 2022-11-12 MED ORDER — IOHEXOL 350 MG/ML SOLN
95.0000 mL | Freq: Once | INTRAVENOUS | Status: AC | PRN
  Administered 2022-11-12: 95 mL via INTRAVENOUS

## 2022-11-12 MED ORDER — NITROGLYCERIN 0.4 MG SL SUBL
SUBLINGUAL_TABLET | SUBLINGUAL | Status: AC
  Filled 2022-11-12: qty 2

## 2022-11-12 MED ORDER — NITROGLYCERIN 0.4 MG SL SUBL
0.8000 mg | SUBLINGUAL_TABLET | Freq: Once | SUBLINGUAL | Status: AC
  Administered 2022-11-12: 0.8 mg via SUBLINGUAL

## 2022-11-13 ENCOUNTER — Ambulatory Visit (HOSPITAL_COMMUNITY)
Admission: RE | Admit: 2022-11-13 | Discharge: 2022-11-13 | Disposition: A | Discharge status: Home / Self Care | Payer: Medicare Other | Source: Ambulatory Visit | Attending: Cardiology | Admitting: Cardiology

## 2022-11-13 ENCOUNTER — Other Ambulatory Visit: Payer: Self-pay | Admitting: Cardiology

## 2022-11-13 DIAGNOSIS — R931 Abnormal findings on diagnostic imaging of heart and coronary circulation: Secondary | ICD-10-CM

## 2022-11-13 DIAGNOSIS — R269 Unspecified abnormalities of gait and mobility: Secondary | ICD-10-CM | POA: Diagnosis not present

## 2022-11-14 ENCOUNTER — Ambulatory Visit: Payer: Medicare Other | Admitting: Physical Therapy

## 2022-11-18 ENCOUNTER — Encounter: Payer: Self-pay | Admitting: Cardiology

## 2022-11-18 ENCOUNTER — Ambulatory Visit: Payer: Medicare Other | Admitting: Cardiology

## 2022-11-18 ENCOUNTER — Ambulatory Visit: Payer: Medicare Other

## 2022-11-18 VITALS — BP 141/73 | HR 56 | Resp 16 | Ht 69.0 in | Wt 193.0 lb

## 2022-11-18 DIAGNOSIS — I2510 Atherosclerotic heart disease of native coronary artery without angina pectoris: Secondary | ICD-10-CM | POA: Insufficient documentation

## 2022-11-18 DIAGNOSIS — R0609 Other forms of dyspnea: Secondary | ICD-10-CM

## 2022-11-18 DIAGNOSIS — R931 Abnormal findings on diagnostic imaging of heart and coronary circulation: Secondary | ICD-10-CM

## 2022-11-18 DIAGNOSIS — R55 Syncope and collapse: Secondary | ICD-10-CM

## 2022-11-18 NOTE — Progress Notes (Signed)
Patient referred by [name], DO for palpitations  Subjective:   Gregory Perez, male    DOB: 17-Nov-1940, 82 y.o.   MRN: [id number]   Chief Complaint  Patient presents with   PSVT   Results    Echo and heart monitor     HPI  82 y.o. Caucasian male with hypertension, hyperlipidemia, elevated coronary calcium score, PSVT  Office visit 11/01/2022: Patient is here today with his wife after a syncope episode on 10/31/2022. I saw the patient on 10/23/2022 with complaints of exertional dyspnea for which he is scheduled to undergo coronary CTA and echocardiogram. He was was also started on spironolactone 25 mg daily, which he tolerated well for a week. On 10/23/2022, he stepped put to walk. He hadn't been feeling very well in the morning, but did not have any specific complaints. On his way back from the walk, patient had a sudden syncope episode without any warning signs of lightheadedness, no chest pain or dyspnea. Patient fell down on his face and had minor facial injuries. Two passerby individuals helped him back home. He had lost consciousness for a few seconds. Patient had a similar episode two years ago.  Patient has not had any recurrent syncope episodes since then.  Heart rate has been in 40s and 50s at rest without any lightheadedness or loss of consciousness. Reviewed recent test results with the patient, details below.   Cardiac telemetry phone report is still pending, but there were no severe arrhythmia event alerts during the time that he wore the monitor.  On the last day of him wearing the monitor, there appears to have been loss of transmission as he was removing the monitor, but was not associated with a syncope or presyncope event.     Current Outpatient Medications:    amLODipine (NORVASC) 10 MG tablet, , Disp: , Rfl:    atorvastatin (LIPITOR) 40 MG tablet, Take 1 tablet (40 mg total) by mouth daily., Disp: 90 tablet, Rfl: 3   buPROPion (WELLBUTRIN XL) 150 MG 24 hr  tablet, Take 150 mg by mouth at bedtime., Disp: , Rfl:    Cholecalciferol (VITAMIN D3) 25 MCG (1000 UT) CAPS, Take by mouth., Disp: , Rfl:    cyanocobalamin 1000 MCG tablet, Take 1,000 mcg by mouth daily., Disp: , Rfl:    finasteride (PROSCAR) 5 MG tablet, Take by mouth., Disp: , Rfl:    FLUoxetine (PROZAC) 20 MG capsule, TAKE 1 CAPSULE BY MOUTH EVERY DAY WITH 40 MG for 90, Disp: , Rfl:    FLUoxetine (PROZAC) 40 MG capsule, Take by mouth., Disp: , Rfl:    losartan (COZAAR) 100 MG tablet, Take 100 mg by mouth daily., Disp: , Rfl:    metoprolol succinate (TOPROL-XL) 25 MG 24 hr tablet, Take 1 tablet (25 mg total) by mouth daily. Take with or immediately following a meal., Disp: 90 tablet, Rfl: 3   spironolactone (ALDACTONE) 25 MG tablet, Take 1 tablet (25 mg total) by mouth daily., Disp: 30 tablet, Rfl: 3    Cardiovascular and other pertinent studies:  EKG 11/01/2022: Sinus rhythm 51 bpm Possible old anteroseptla infarct No significant change compared to precious EKG  EKG 07/22/2022: Sinus rhythm 54 bpm Possible old anteroseptal infarct  Echocardiogram 11/18/2022: Left ventricle cavity is normal in size and wall thickness. Normal global wall motion. Normal LV systolic function with EF 59%. Indeterminate diastolic filling pattern.  Trileaflet aortic valve with aortic sclerosis. No significant stenosis or regurgitation. No evidence of pulmonary hypertension.  CTA cor 11/12/2022: 1. Total coronary calcium score of 260. This was 35th percentile for age and sex matched control. 2. Normal coronary origin with right dominance. 3. CAD-RADS = 3.  Left Main: Mild stenosis (25-49%) closer to 25% due to eccentric calcified plaque at distal left main. LAD: Mild stenosis (25-49%) due to soft plaque at the proximal LAD. Ramus: Moderate stenosis (50-69%) closer to 50% due to calcified plaque at proximal/mid segment otherwise vessel is patent. LCx: Patent. RCA: Moderate stenosis (50-69%) closer to  60% stenosis due to mixed plaque at distal RCA.   4. Aortic atherosclerosis (soft and calcified plaque). 5. Patent Foramen ovale. 6. The aortic valve is trileaflet with sclerosis (consider echocardiogram to evaluate hemodynamics).   4. Study is sent for CT-FFR to further evaluate ramus and RCA. Findings will be performed and reported separately.   CT FFR 11/13/2022: 1. Left Main: FFR = 1.0   2. LAD: Proximal FFR = 0.97, mid FFR = 0.93, distal FFR = 0.86 3. LCX: Proximal FFR = 0.97, distal FFR = 0.96 4. Ramus: Proximal FFR = 0.96, distal FFR = 0.92 5. RCA: Proximal FFR = 0.99, mid FFR =0.95, distal FFR = 0.95   IMPRESSION: 1.  CT FFR analysis showed no significant stenosis.   RECOMMENDATIONS: Goal directed medical therapy and aggressive risk factor modification for secondary prevention of coronary artery disease.  Mobile cardiac telemetry 13 days 05/22/2022 - 06/05/2022: Dominant rhythm: Sinus. HR 44-137 bpm. Avg HR 64 bpm, in sinus rhythm. 42 episodes of SVT, fastest at 185 bpm for 15 beats, longest for 17 beats at 102 bpm. <1% isolated SVE, couplet/triplets. 0 episodes of VT. <1% isolated VE, couplets. No atrial fibrillation/atrial flutter/VT/high grade AV block, sinus pause >3sec noted. 19 patient triggered events, correlated with SVE/SVT  CT cardiac scoring 04/19/2022: Left Main: 20.4 LAD: 84.6 LCx: 0 RCA: 78.7   Total Agatston Score: 184 MESA database percentile: Thirty-third Aortic atherosclerosis  AORTA MEASUREMENTS:   Ascending Aorta: 38 mm Descending Aorta: 34 mm  EKG 02/13/2022: Sinus rhythm 52 bpm Cannot exclude old anteroseptal infarct Nonspecific T-abnormality  Exercise treadmill stress test 05/14/2021: Exercise treadmill stress test performed using Bruce protocol.  Patient reached 7 METS, and 80% of age predicted maximum heart rate.  Exercise capacity was low. No chest pain reported.  Dyspnea reported.  Normal heart rate and hemodynamic  response. Stress EKG at 80% MPHR showed sinus tachycardia, T wave inversion in leads II, III, aVF, return to baseline within 2 min into recovery. These changes are equivocal for ischemic at suboptimal exercise. No arrythmia seen.  Recommend clinical correlation.     Recent labs: 09/29/2022: Glucose 94, BUN/Cr 18/0.8. EGFR 112. Na/K 140/3.4. Rest of the CMP normal H/H 15/46. MCV 90. Platelets 198 TSH 1.9 normal  07/04/2022: Glucose 81, BUN/Cr 21/1.11. eGFR 66. Na/K 145/3.6  04/18/2021: Glucose 96, BUN/Cr 29/1.0. EGFR 64. HbA1C 5.4% Chol 153, TG 111, HDL 40, LDL 91 TSH 0.9 normal    Review of Systems  Cardiovascular:  Positive for syncope. Negative for chest pain, dyspnea on exertion, leg swelling and palpitations.  Neurological:  Negative for focal weakness and headaches.         Vitals:   11/18/22 1403  BP: (!) 141/73  Pulse: (!) 56  Resp: 16  SpO2: 94%   Orthostatic VS for the past 72 hrs (Last 3 readings):  Patient Position BP Location Cuff Size  11/18/22 1403 Sitting Left Arm Normal      Body mass index is 28.5  kg/m. Filed Weights   11/18/22 1403  Weight: 193 lb (87.5 kg)     Objective:   Physical Exam Vitals and nursing note reviewed.  Constitutional:      General: He is not in acute distress. HENT:     Head:     Comments: Minor facial injuries Neck:     Vascular: No JVD.  Cardiovascular:     Rate and Rhythm: Normal rate and regular rhythm.     Heart sounds: Normal heart sounds. No murmur heard. Pulmonary:     Effort: Pulmonary effort is normal.     Breath sounds: Normal breath sounds. No wheezing or rales.  Musculoskeletal:     Right lower leg: No edema.     Left lower leg: No edema.        Assessment & Recommendations:   82 y.o. Caucasian male with hypertension, hyperlipidemia, elevated coronary calcium score, PSVT, syncope  Syncope: No clear cardiac etiology identified, but concern regarding arrhythmic event remains. Nonobstructive  coronary artery disease, structurally normal heart other than incidental finding of PFO on CT scan, not noted on echocardiogram. 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  I would recommend stopping metoprolol given his resting heart rate in 40s at times. In addition, I recommend placement of implantable loop recorder for continuous heart rhythm monitoring.  Elevated coronary calcium score: Given LM calcificaiton and absence of bleeding, okay to continue Aspirin 81 mg daily. Continue lipitor 40 mg daily.  Hypertension: After stopping metoprolol today, anticipate there may be some increasing blood pressure readings.  As necessary, antihypertensive therapy can be further adjusted.  Overall, patient is shaken since his syncope event, which is expected.  However, have reassured him that I am not seeing any serious cardiac abnormality that warrant any intervention. Patient has upcoming trip to [place] and [place] in July with his grandson.  So far, I have not found any severe reversible abnormalities that would preclude him from traveling in July as planned.  Loop recorder may shed more light if he is having any arrhythmic events.  As things stand, I think he will be able to travel in July.  He has been unsteady on his feet, possibly due to question of peripheral neuropathy or merely loss of confidence.  He is going to use a wheelchair at the airport, which I think is a very good idea.  I will see him in first week of July prior to his planned trip in mid July.   Elder Negus, MD Pager: [phone] Office: [phone]

## 2022-11-19 ENCOUNTER — Ambulatory Visit: Payer: Medicare Other | Admitting: Physical Therapy

## 2022-11-19 VITALS — BP 118/52 | HR 59

## 2022-11-19 DIAGNOSIS — R269 Unspecified abnormalities of gait and mobility: Secondary | ICD-10-CM

## 2022-11-19 DIAGNOSIS — R2681 Unsteadiness on feet: Secondary | ICD-10-CM

## 2022-11-19 NOTE — Therapy (Signed)
OUTPATIENT PHYSICAL THERAPY NEURO EVALUATION   Patient Name: Gregory Perez MRN: [id number] DOB:[birth year], 82 y.o., male Today's Date: 11/19/2022   PCP: [name], DO REFERRING PROVIDER: Dohmeier, Porfirio Mylar, MD  END OF SESSION:  PT End of Session - 11/19/22 1029     Visit Number 2    Number of Visits 10    Date for PT Re-Evaluation 01/06/23    Authorization Type Medicare and [employer] Secondary    PT Start Time 1016    PT Stop Time 1103    PT Time Calculation (min) 47 min    Equipment Utilized During Treatment Gait belt    Activity Tolerance Patient tolerated treatment well    Behavior During Therapy WFL for tasks assessed/performed            Past Medical History:  Diagnosis Date   HLD (hyperlipidemia)    Hypertension    Hypokalemia    Major depression    No past surgical history on file. Patient Active Problem List   Diagnosis Date Noted   Coronary artery disease involving native coronary artery of native heart without angina pectoris 11/18/2022   Syncope and collapse 11/02/2022   Abnormal electrocardiogram 10/23/2022   Exertional dyspnea 10/23/2022   Pain in right paraspinal region 10/16/2022   Spinal stenosis of lumbar region without neurogenic claudication 10/16/2022   Strain of right hip adductor muscle 10/16/2022   PSVT (paroxysmal supraventricular tachycardia) 06/19/2022   Agatston coronary artery calcium score between 100 and 400 06/19/2022   Essential hypertension 02/13/2022   Hypertensive heart disease without heart failure 02/13/2022   Abnormal stress ECG with treadmill 08/16/2021   Palpitations 05/04/2021   Encounter for counseling on use of bilevel positive airway pressure (BiPAP) therapy 04/12/2020   Controlled REM sleep behavior disorder 04/12/2020   Chronic intermittent hypoxia with obstructive sleep apnea 01/10/2020   REM sleep behavior disorder 12/16/2019   Abnormal dreams 12/16/2019    ONSET DATE: 11/06/2022 (referral  date)  REFERRING DIAG: M54.89 (ICD-10-CM) - Pain in right paraspinal region M48.061 (ICD-10-CM) - Spinal stenosis of lumbar region without neurogenic claudication S76.011A (ICD-10-CM) - Strain of right hip adductor muscle, initial encounter R29.6 (ICD-10-CM) - Frequent falls  THERAPY DIAG:  Abnormality of gait and mobility  Unsteadiness on feet  Rationale for Evaluation and Treatment: Rehabilitation  SUBJECTIVE:                                                                                                                                                                                             SUBJECTIVE STATEMENT: Patient reports back from cardiology and is doing well; was medically cleared  for his trip. Recommend close BP monitoring as patient reports having one beta blocker held. Patient reports that he is to have a HR monitor placed tomorrow. Denies falls/near falls.  Pt accompanied by: self  PERTINENT HISTORY: hypertension, hyperlipidemia, elevated coronary calcium score, PSVT  PAIN:  Are you having pain? No  PRECAUTIONS: Fall - recent syncope episode (monitor vitals)  WEIGHT BEARING RESTRICTIONS: No  FALLS: Has patient fallen in last 6 months? Yes. 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 Multilevel spinal and foraminal encroachment throughout the cervical spine as described above.   Patchy hyperintensity in the pons bilaterally most likely chronic microvascular ischemic  change.  EKG 11/01/2022: Sinus rhythm 51 bpm Possible old anteroseptla infarct No significant change compared to precious EKG   EKG 07/22/2022: Sinus rhythm 54 bpm Possible old anteroseptal infarct  COGNITION: Overall cognitive status: Within functional limits for tasks assessed - reports mild word recall difficulty and minor short term changes but nothing major    TODAY'S TREATMENT:                                                                                                                               Vitals:   11/19/22 1034  BP: (!) 118/52  Pulse: (!) 59    TherAct (Initial values for goals):   [facility] PT Assessment - 11/19/22 0001       6 minute walk test results    Aerobic Endurance Distance Walked 1104   feet with SBA (1 standing break) (336 meters) (normative value for 80-89 is 417 meters)     Standardized Balance Assessment   Standardized Balance Assessment Mini-BESTest      Mini-BESTest   Sit To Stand Normal: Comes to stand without use of hands and stabilizes independently.    Rise to Toes Moderate: Heels up, but not full range (smaller than when holding hands), OR noticeable instability for 3 s.    Stand on one leg (left) Moderate: < 20 s    Stand on one leg (right) Moderate: < 20 s    Stand on one leg - lowest score 1    Compensatory Stepping Correction - Forward Moderate: More than one step is required to recover equilibrium    Compensatory Stepping Correction - Backward Moderate: More than one step is required to recover equilibrium    Compensatory Stepping Correction - Left Lateral Severe: Falls, or cannot step    Compensatory Stepping Correction - Right Lateral  Severe:  Falls, or cannot step    Stepping Corredtion Lateral - lowest score 0    Stance - Feet together, eyes open, firm surface  Normal: 30s    Stance - Feet together, eyes closed, foam surface  Moderate: < 30s    Incline - Eyes Closed Normal: Stands independently 30s and aligns with gravity     Change in Gait Speed Normal: Significantly changes walkling speed without imbalance    Walk with head turns - Horizontal Moderate: performs head turns with reduction in gait speed.    Walk with pivot turns Normal: Turns with feet close FAST (< 3 steps) with good balance.    Step over obstacles Moderate: Steps over box but touches box OR displays cautious behavior by slowing gait.    Timed UP & GO with Dual Task Moderate: Dual Task affects either counting OR walking (>10%) when compared to the TUG without Dual Task.    Mini-BEST total score 18           RPE pretest : 7/10 RPE post : 9/10   PATIENT EDUCATION: Education details: miniBEST/6MWT results Person educated: Patient Education method: Explanation Education comprehension: verbalized understanding and needs further education  HOME EXERCISE PROGRAM: To be provided  GOALS: Goals reviewed with patient? Yes  SHORT TERM GOALS: Target date: 12/02/2022  Patient will demonstrate 100% compliance with initial HEP to continue to progress between physical therapy sessions.   Baseline: To be provided Goal status: INITIAL  2. Patient will improve their 5x Sit to Stand score to less than 12 seconds to demonstrate a decreased risk for falls and improved LE strength.   Baseline: 13.74" Goal status: INITIAL  3.  Therapist will assess miniBEST and write goal as indicated to help target balance interventions for patient's internation trip.  Baseline: Assessed on 5/14 Goal status: INITIAL  4.  Therapist will assess and write goal as indicated to help target balance interventions for patient's internation trip.  Baseline: Assessed on 5/14 Goal status: INITIAL  LONG TERM GOALS: Target date: 12/23/2022  Patient will report demonstrate independence with final HEP in order to maintain current gains and continue to progress after physical therapy discharge.   Baseline: To be provided Goal status: INITIAL  2.  Patient will  improve gait speed to 1.1 m/s or greater to indicate a reduced risk for falls.   Baseline: 0.93 m/s without AD  (SBA) Goal status: INITIAL  3.  Patient will improve score by 50 meters to demonstrate a clinically meaningful improvement in aerobic capacity and endurance needed for ambulating in the community.   Baseline: 336 meters Goal status: INITIAL  4. Patient will improve miniBEST score to 20 or greater to indicate a decreased risk of falls and improved static stability.  Baseline: 18 Goal status: INITIAL   ASSESSMENT:  CLINICAL IMPRESSION: Session emphasized further assessment of balance and cardiovascular endurance. Patient scored below 20 on miniBEST indicating high falls risk with significant challenges with reactive balance and would have fallen on one attempt had therapist not prevent fall. Patient also presents with decreased cardiovascular endurance based on age matched norms; patient has forward lean and difficulty keeping feet beneath him with forward gait and required 1 standing break during test.  Patient will benefit from skilled physical therapy services in order to help him meet his goal of being able to travel in [place] with his grandson.   OBJECTIVE IMPAIRMENTS: Abnormal gait, decreased activity tolerance, decreased balance, decreased endurance, decreased mobility, difficulty walking,  and decreased strength.   ACTIVITY LIMITATIONS: carrying, standing, squatting, and locomotion level  PARTICIPATION LIMITATIONS: occupation, yard work, and travel  PERSONAL FACTORS: Age and 3+ comorbidities: see above  are also affecting patient's functional outcome.   REHAB POTENTIAL: Good  CLINICAL DECISION MAKING: Stable/uncomplicated  EVALUATION COMPLEXITY: Low  PLAN:  PT FREQUENCY: 1-2x/week  PT DURATION: 6 weeks  PLANNED INTERVENTIONS: Therapeutic exercises, Therapeutic activity, Neuromuscular re-education, Balance training, Gait training, Patient/Family education,  Self Care, Manual therapy, and Re-evaluation  PLAN FOR NEXT SESSION: initial HEP with emphasis on balance and walking/cardiovascular endurance, follow up about cardiology results/testing  Carmelia Bake, PT, DPT 11/19/2022, 12:20 PM

## 2022-11-20 ENCOUNTER — Encounter: Payer: Self-pay | Admitting: Cardiology

## 2022-11-20 ENCOUNTER — Ambulatory Visit: Payer: Medicare Other | Admitting: Cardiology

## 2022-11-20 VITALS — BP 144/79 | HR 80 | Resp 16 | Ht 69.0 in | Wt 198.4 lb

## 2022-11-20 DIAGNOSIS — R55 Syncope and collapse: Secondary | ICD-10-CM

## 2022-11-20 NOTE — Progress Notes (Signed)
  Prodedure performed: Insertion of Biotronik Loop Recorder. Serial # [id number]  Indication: Syncope  PHYSICAL EXAM:    11/20/2022   10:52 AM 11/19/2022   10:34 AM 11/18/2022    2:03 PM  Vitals with BMI  Height 5\' 9"   5\' 9"   Weight 198 lbs 6 oz  193 lbs  BMI 29.29  28.49  Systolic 144 118 540  Diastolic 79 52 73  Pulse 80 59 56    Physical Exam  Constitutional: No distress.  HENT:  Nose: Nose normal. No nasal discharge.  Eyes: Conjunctivae are normal.  Neck: No JVD present.  Cardiovascular: Normal rate, regular rhythm, S1 normal and S2 normal. Exam reveals no gallop, no S3 and no S4.  No murmur heard. Pulses:      Carotid pulses are 2+ on the right side and 2+ on the left side.      Radial pulses are 2+ on the right side and 2+ on the left side.       Femoral pulses are 2+ on the right side and 2+ on the left side.      Popliteal pulses are 2+ on the right side and 2+ on the left side.       Dorsalis pedis pulses are 2+ on the right side and 2+ on the left side.       Posterior tibial pulses are 2+ on the right side and 2+ on the left side.  Pulmonary/Chest: Effort normal and breath sounds normal. No stridor. He has no wheezes. He has no rales. He exhibits no tenderness.  Abdominal: Soft. Bowel sounds are normal. He exhibits no distension. There is no abdominal tenderness.  Musculoskeletal:        General: No tenderness or edema. Normal range of motion.     Cervical back: Neck supple.  Neurological: He is alert and oriented to person, place, and time.  Skin: Skin is warm and dry. No rash noted. No cyanosis. No jaundice. Nails show no clubbing.    After obtaining informed consent, explaining the procedure to the patient, under sterile precautions, local anesthesia with 1% lidocaine with epinephrine was injected subcutaneously in the left parasternal region.  20 mL utilized.   A small nick was made in the left paraspinal region at the 3rd intercostal space. Biotronik loop  recorder was inserted without any complications.   Patient tolerated the procedure well.   The incision was closed with 3 sutures and steri-strips.   There is no blood loss, no hematoma.  Written instrtuctions regarding wound care given to the patient.  Device setting: R wave amplitude 0.47mV.  Programmed to AF detection to Medium.  HVR 150/min. Bradycardia 30 BPM Sudden drop by 50% Asystole 3 Sec    ICD-10-CM   1. Syncope and collapse  R55        Delilah Shan [facility]  Pager:  [phone] Office: [phone]

## 2022-11-21 ENCOUNTER — Encounter: Payer: Self-pay | Admitting: Orthopaedic Surgery

## 2022-11-21 ENCOUNTER — Ambulatory Visit: Payer: Medicare Other | Admitting: Physical Therapy

## 2022-11-21 ENCOUNTER — Encounter: Payer: Self-pay | Admitting: Cardiology

## 2022-11-21 DIAGNOSIS — Z95818 Presence of other cardiac implants and grafts: Secondary | ICD-10-CM | POA: Insufficient documentation

## 2022-11-21 HISTORY — DX: Presence of other cardiac implants and grafts: Z95.818

## 2022-11-25 NOTE — Telephone Encounter (Signed)
Pt had Lumbar MRI at [facility] so we can see his report. Do you want him to bring a disc to his appointment?

## 2022-11-26 ENCOUNTER — Encounter: Payer: Self-pay | Admitting: Cardiology

## 2022-11-26 NOTE — Telephone Encounter (Signed)
Please send to MP.   ST

## 2022-11-26 NOTE — Telephone Encounter (Signed)
From patient.

## 2022-11-27 ENCOUNTER — Ambulatory Visit: Payer: Medicare Other | Admitting: Cardiology

## 2022-11-27 ENCOUNTER — Other Ambulatory Visit: Payer: Medicare Other

## 2022-11-27 VITALS — BP 146/83 | HR 73 | Wt 198.2 lb

## 2022-11-27 DIAGNOSIS — Z4802 Encounter for removal of sutures: Secondary | ICD-10-CM

## 2022-11-27 NOTE — Progress Notes (Signed)
   Loop Recorder Implant follow up.   Patient came in for suture removal after recently undergoing Biotronik Loop Recorder on 11/20/2022.   The site is clean, dry, healing well, mild ecchymosis.   Suture removed and bandage applied.   No signs of infection.   Discussed wound care w/ patient.  Confirmed with pacemaker clinic the transition are being received.      ICD-10-CM   1. Encounter for removal of sutures  Z48.02      No charge.   Tessa Lerner, [place], [facility]  Pager:  [phone] Office: [phone]

## 2022-11-27 NOTE — Telephone Encounter (Signed)
From patient.

## 2022-11-28 ENCOUNTER — Encounter: Payer: Self-pay | Admitting: Physical Therapy

## 2022-11-28 ENCOUNTER — Ambulatory Visit: Payer: Medicare Other | Admitting: Physical Therapy

## 2022-11-28 VITALS — BP 129/77 | HR 79

## 2022-11-28 DIAGNOSIS — R2681 Unsteadiness on feet: Secondary | ICD-10-CM

## 2022-11-28 DIAGNOSIS — R269 Unspecified abnormalities of gait and mobility: Secondary | ICD-10-CM | POA: Diagnosis not present

## 2022-11-28 NOTE — Therapy (Signed)
OUTPATIENT PHYSICAL THERAPY NEURO TREATMENT   Patient Name: Gregory Perez MRN: [id number] DOB:1941/01/05, 82 y.o., male Today's Date: 11/28/2022   PCP: [name], DO REFERRING PROVIDER: Dohmeier, Porfirio Mylar, MD  END OF SESSION:  PT End of Session - 11/28/22 1105     Visit Number 3    Number of Visits 10    Date for PT Re-Evaluation 01/06/23    Authorization Type Medicare and [employer] Secondary    PT Start Time 1105    PT Stop Time 1145    PT Time Calculation (min) 40 min    Equipment Utilized During Treatment Gait belt    Activity Tolerance Patient tolerated treatment well    Behavior During Therapy WFL for tasks assessed/performed            Past Medical History:  Diagnosis Date   HLD (hyperlipidemia)    Hypertension    Hypokalemia    Loop Recorder Biomonitor III 11/20/2022 11/21/2022   Major depression    History reviewed. No pertinent surgical history. Patient Active Problem List   Diagnosis Date Noted   Loop Recorder Biomonitor III 11/20/2022 11/21/2022   Coronary artery disease involving native coronary artery of native heart without angina pectoris 11/18/2022   Syncope and collapse 11/02/2022   Abnormal electrocardiogram 10/23/2022   Exertional dyspnea 10/23/2022   Pain in right paraspinal region 10/16/2022   Spinal stenosis of lumbar region without neurogenic claudication 10/16/2022   Strain of right hip adductor muscle 10/16/2022   PSVT (paroxysmal supraventricular tachycardia) 06/19/2022   Agatston coronary artery calcium score between 100 and 400 06/19/2022   Essential hypertension 02/13/2022   Hypertensive heart disease without heart failure 02/13/2022   Abnormal stress ECG with treadmill 08/16/2021   Palpitations 05/04/2021   Encounter for counseling on use of bilevel positive airway pressure (BiPAP) therapy 04/12/2020   Controlled REM sleep behavior disorder 04/12/2020   Chronic intermittent hypoxia with obstructive sleep apnea 01/10/2020    REM sleep behavior disorder 12/16/2019   Abnormal dreams 12/16/2019    ONSET DATE: 11/06/2022 (referral date)  REFERRING DIAG: M54.89 (ICD-10-CM) - Pain in right paraspinal region M48.061 (ICD-10-CM) - Spinal stenosis of lumbar region without neurogenic claudication S76.011A (ICD-10-CM) - Strain of right hip adductor muscle, initial encounter R29.6 (ICD-10-CM) - Frequent falls  THERAPY DIAG:  Abnormality of gait and mobility  Unsteadiness on feet  Rationale for Evaluation and Treatment: Rehabilitation  SUBJECTIVE:  SUBJECTIVE STATEMENT: Patient arrives to session with multiple questions pertaining to physical function and prognosis and other recent test results. Denies falls/near falls. Patient states he has a SPC with quad tip at home.   Pt accompanied by: self  PERTINENT HISTORY: hypertension, hyperlipidemia, elevated coronary calcium score, PSVT  PAIN:  Are you having pain? No  PRECAUTIONS: Fall - recent syncope episode (monitor vitals)  WEIGHT BEARING RESTRICTIONS: No  FALLS: Has patient fallen in last 6 months? Yes. 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 Multilevel spinal and foraminal encroachment throughout the cervical spine as described above.   Patchy hyperintensity in the pons bilaterally  most likely chronic microvascular ischemic change.  EKG 11/01/2022: Sinus rhythm 51 bpm Possible old anteroseptla infarct No significant change compared to precious EKG   EKG 07/22/2022: Sinus rhythm 54 bpm Possible old anteroseptal infarct  COGNITION: Overall cognitive status: Within functional limits for tasks assessed - reports mild word recall difficulty and minor short term changes but nothing major    TODAY'S TREATMENT:                                                                                                                               Vitals:   11/28/22 1130  BP: 129/77  Pulse: 79   TherAct:  Spent first part of session explaining why patient had received medical referral. Discussed patient's concern about how no one seemed to know what was going on with him. Read imaging interpretations with patient and explained that diagnosis specific to other providers in medical team. Discussed importance of working on mobility challenges and ways to work on forward lean and balance. Explained that some may be structural in nature and will have to work on compensation strategies and use of AD possibly instead to keep patient safe.   AD Trial Training: Trialed SPC with quad tip 1 x 115' with CGA - patient unable to sequence correctly despite max cues and breaking down with both visual/tactile cues Trialed rollator - educated on upright posture and introduced use of breaks for seated break, increased stability noted, patient did scuff feet 2x  Baseline gait: forward head, forward lean, poor foot clearance, decreased arm swing   PATIENT EDUCATION: Education details: Education as noted above, education on need for AD trial Person educated: Patient Education method: Explanation Education comprehension: verbalized understanding and needs further education  HOME EXERCISE PROGRAM: To be provided  GOALS: Goals reviewed with patient? Yes  SHORT TERM GOALS: Target date:  12/02/2022  Patient will demonstrate 100% compliance with initial HEP to continue to progress between physical therapy sessions.   Baseline: To be provided Goal status: INITIAL  2. Patient will improve their 5x Sit to Stand score to less than 12 seconds to demonstrate a decreased risk for falls and improved LE strength.   Baseline: 13.74" Goal status: INITIAL  3.  Therapist will assess miniBEST and write goal as indicated to help target balance interventions for patient's internation trip.  Baseline: Assessed on 5/14 Goal status: INITIAL  4.  Therapist will assess and write goal as indicated to help target balance interventions for patient's internation trip.  Baseline: Assessed on 5/14 Goal status: INITIAL  LONG TERM GOALS: Target date: 12/23/2022  Patient will report demonstrate independence with final HEP in order to maintain current gains and continue to progress after physical therapy discharge.   Baseline: To be provided Goal status: INITIAL  2.  Patient will improve gait speed to 1.1 m/s or greater to indicate a reduced risk for falls.   Baseline: 0.93 m/s without AD  (SBA) Goal status: INITIAL  3.  Patient will improve score by 50 meters to demonstrate a clinically meaningful improvement in aerobic capacity and endurance needed for ambulating in the community.   Baseline: 336 meters Goal status: INITIAL  4. Patient will improve miniBEST score to 20 or greater to indicate a decreased risk of falls and improved static stability.  Baseline: 18 Goal status: INITIAL   ASSESSMENT:  CLINICAL IMPRESSION: Session emphasized patient education in addition to AD trial. Patient was unable to coordinate UE with LE with trial of cane and demonstrated improved stability and navigation with rollator. Will require further trial.  Patient will benefit from skilled physical therapy services in order to help him meet his goal of being able to travel in [place] with his grandson.  Continue POC.  OBJECTIVE IMPAIRMENTS: Abnormal gait, decreased activity tolerance, decreased balance, decreased endurance, decreased mobility, difficulty walking, and decreased strength.   ACTIVITY LIMITATIONS: carrying, standing, squatting, and locomotion level  PARTICIPATION LIMITATIONS: occupation, yard work, and travel  PERSONAL FACTORS: Age and 3+ comorbidities: see above  are also affecting patient's functional outcome.   REHAB POTENTIAL: Good  CLINICAL DECISION MAKING: Stable/uncomplicated  EVALUATION COMPLEXITY: Low  PLAN:  PT FREQUENCY: 1-2x/week  PT DURATION: 6 weeks  PLANNED INTERVENTIONS: Therapeutic exercises, Therapeutic activity, Neuromuscular re-education, Balance training, Gait training, Patient/Family education, Self Care, Manual therapy, and Re-evaluation  PLAN FOR NEXT SESSION: initial HEP with emphasis on balance and walking/cardiovascular endurance, follow up about cardiology results/testing, AD trial  Carmelia Bake, PT, DPT 11/28/2022, 12:30 PM

## 2022-11-29 ENCOUNTER — Ambulatory Visit: Payer: Medicare Other | Admitting: Physical Therapy

## 2022-12-05 ENCOUNTER — Encounter: Payer: Self-pay | Admitting: Physical Therapy

## 2022-12-05 ENCOUNTER — Ambulatory Visit: Payer: Medicare Other | Admitting: Physical Therapy

## 2022-12-05 VITALS — BP 135/84 | HR 71

## 2022-12-05 DIAGNOSIS — R2681 Unsteadiness on feet: Secondary | ICD-10-CM

## 2022-12-05 DIAGNOSIS — R269 Unspecified abnormalities of gait and mobility: Secondary | ICD-10-CM

## 2022-12-05 NOTE — Therapy (Signed)
OUTPATIENT PHYSICAL THERAPY NEURO TREATMENT   Patient Name: Gregory Perez MRN: [id number] DOB:March 11, 1941, 82 y.o., male Today's Date: 12/05/2022   PCP: [name], DO REFERRING PROVIDER: Dohmeier, Porfirio Mylar, MD  END OF SESSION:  PT End of Session - 12/05/22 1428     Visit Number 4    Number of Visits 10    Date for PT Re-Evaluation 01/06/23    Authorization Type Medicare and [employer] Secondary    PT Start Time 1425    PT Stop Time 1518    PT Time Calculation (min) 53 min    Equipment Utilized During Treatment Gait belt    Activity Tolerance Patient tolerated treatment well    Behavior During Therapy WFL for tasks assessed/performed            Past Medical History:  Diagnosis Date   HLD (hyperlipidemia)    Hypertension    Hypokalemia    Loop Recorder Biomonitor III 11/20/2022 11/21/2022   Major depression    History reviewed. No pertinent surgical history. Patient Active Problem List   Diagnosis Date Noted   Loop Recorder Biomonitor III 11/20/2022 11/21/2022   Coronary artery disease involving native coronary artery of native heart without angina pectoris 11/18/2022   Syncope and collapse 11/02/2022   Abnormal electrocardiogram 10/23/2022   Exertional dyspnea 10/23/2022   Pain in right paraspinal region 10/16/2022   Spinal stenosis of lumbar region without neurogenic claudication 10/16/2022   Strain of right hip adductor muscle 10/16/2022   PSVT (paroxysmal supraventricular tachycardia) 06/19/2022   Agatston coronary artery calcium score between 100 and 400 06/19/2022   Essential hypertension 02/13/2022   Hypertensive heart disease without heart failure 02/13/2022   Abnormal stress ECG with treadmill 08/16/2021   Palpitations 05/04/2021   Encounter for counseling on use of bilevel positive airway pressure (BiPAP) therapy 04/12/2020   Controlled REM sleep behavior disorder 04/12/2020   Chronic intermittent hypoxia with obstructive sleep apnea 01/10/2020    REM sleep behavior disorder 12/16/2019   Abnormal dreams 12/16/2019    ONSET DATE: 11/06/2022 (referral date)  REFERRING DIAG: M54.89 (ICD-10-CM) - Pain in right paraspinal region M48.061 (ICD-10-CM) - Spinal stenosis of lumbar region without neurogenic claudication S76.011A (ICD-10-CM) - Strain of right hip adductor muscle, initial encounter R29.6 (ICD-10-CM) - Frequent falls  THERAPY DIAG:  Abnormality of gait and mobility  Unsteadiness on feet  Rationale for Evaluation and Treatment: Rehabilitation  SUBJECTIVE:  SUBJECTIVE STATEMENT: Patient reports that he did the treadmill today for a few minutes. Denies falls/near falls. Is going on vacation midway through July.   Pt accompanied by: self  PERTINENT HISTORY: hypertension, hyperlipidemia, elevated coronary calcium score, PSVT  PAIN:  Are you having pain? No  PRECAUTIONS: Fall - recent syncope episode (monitor vitals)  WEIGHT BEARING RESTRICTIONS: No  FALLS: Has patient fallen in last 6 months? Yes. 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 Multilevel spinal and foraminal encroachment throughout the cervical spine as described above.   Patchy hyperintensity in the pons bilaterally most likely chronic microvascular ischemic change.  EKG  11/01/2022: Sinus rhythm 51 bpm Possible old anteroseptla infarct No significant change compared to precious EKG   EKG 07/22/2022: Sinus rhythm 54 bpm Possible old anteroseptal infarct  COGNITION: Overall cognitive status: Within functional limits for tasks assessed - reports mild word recall difficulty and minor short term changes but nothing major    TODAY'S TREATMENT:                                                                                                                               Vitals:   12/05/22 1430  BP: 135/84  Pulse: 71    TherAct:  Discussed oculomotor findings and results. Explained that therapist would reach out to PCP and neurologist to request possible updated imaging if they thought it would be beneficial with session findings. Expressed why therapist would recommend 2WW versus 4WW given level of control. Patient verbalized understanding.   Assessment of STGs   [facility] PT Assessment - 12/05/22 0001       6 minute walk test results    Aerobic Endurance Distance Walked 1029   feet with rollator (4 minutes complete before required seated break - SBA/CGA)     Standardized Balance Assessment   Standardized Balance Assessment Five Times Sit to Stand    Five times sit to stand comments  11.37   sec without UE use           GAIT DURING : Gait pattern: decreased hip/knee flexion- Right, decreased hip/knee flexion- Left, decreased ankle dorsiflexion- Right, decreased ankle dorsiflexion- Left, trunk flexed, poor foot clearance- Right, and poor foot clearance- Left  Distance walked:  Assistive device utilized: rollator Level of assistance: SBA and CGA Comments: Reduced foot clearance, repeated scuffing of feet, extremely forward pulsive with poor control of device, rapid/unsafe pace   GAIT: Gait pattern: decreased hip/knee flexion-  Right, decreased hip/knee flexion- Left, decreased ankle dorsiflexion- Right, decreased ankle dorsiflexion- Left, trunk  flexed, poor foot clearance- Right, and poor foot clearance- Left  Distance walked:  Assistive device utilized: 2WW Level of assistance: SBA Comments: Reduced scuffing, increased upward posture, improved control of pace  NMR:  OCULOMOTOR EXAM:  Ocular Alignment: normal  Ocular ROM: No Limitations  Spontaneous Nystagmus:  slight extra eye movements noted  at baseline with horizontal component, unclear if nystagmus or eyes refixing  Gaze-Induced Nystagmus: left beating with right gaze  Smooth Pursuits: saccades noted slighly before end range  Saccades: extra eye movements  Cover Uncover: 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 Yes  SHORT TERM GOALS: Target date: 12/02/2022  Patient will demonstrate 100% compliance with initial HEP to continue to progress between physical therapy sessions.   Baseline: To be provided  Goal status: INITIAL  2. Patient will improve their 5x Sit to Stand score to less than 12 seconds to demonstrate a decreased risk for falls and improved LE strength.   Baseline: 13.74"; improved to 11.37 sec Goal status: MET  3.  Therapist will assess miniBEST and write goal as indicated to help target balance interventions for patient's internation trip.  Baseline: Assessed on 5/14 Goal status: MET  4.  Therapist will assess and write goal as indicated to help target balance interventions for patient's internation trip.  Baseline: Assessed on 5/14 Goal status: MET  LONG TERM GOALS: Target date: 12/23/2022  Patient will report demonstrate independence with final HEP in order to maintain current gains and continue to progress after physical therapy  discharge.   Baseline: To be provided Goal status: INITIAL  2.  Patient will improve gait speed to 1.1 m/s or greater to indicate a reduced risk for falls.   Baseline: 0.93 m/s without AD  (SBA) Goal status: INITIAL  3.  Patient will improve score by 50 meters to demonstrate a clinically meaningful improvement in aerobic capacity and endurance needed for ambulating in the community.   Baseline: 336 meters Goal status: INITIAL  4. Patient will improve miniBEST score to 20 or greater to indicate a decreased risk of falls and improved static stability.  Baseline: 18 Goal status: INITIAL   ASSESSMENT:  CLINICAL IMPRESSION: Session emphasized assessment of patient's short term goals. Patient is progressing well; however, based on examination findings during today's session,therapist has growing concern for central pathology contributing to balance deficits. During cover/uncover test, patient presents with exotropia bilaterally and increased difficulty with gaze fixation during oculomotor screening. Patient also presents with gait abnormalities including reduced arm swing, shuffled step, and forward lean with absence of rigidity or tremor. Patient was extremely unsafe with use of rollator but improved stability with 2WW. Continues to demonstrate reduced cardiorespiratory endurance during only tolerating 4 minutes. Therapist reached out to both PCP and neurologist for second opinion on MRI/CT for possible imaging. Continue POC.  OBJECTIVE IMPAIRMENTS: Abnormal gait, decreased activity tolerance, decreased balance, decreased endurance, decreased mobility, difficulty walking, and decreased strength.   ACTIVITY LIMITATIONS: carrying, standing, squatting, and locomotion level  PARTICIPATION LIMITATIONS: occupation, yard work, and travel  PERSONAL FACTORS: Age and 3+ comorbidities: see above  are also affecting patient's functional outcome.   REHAB POTENTIAL: Good  CLINICAL  DECISION  MAKING: Stable/uncomplicated  EVALUATION COMPLEXITY: Low  PLAN:  PT FREQUENCY: 1-2x/week  PT DURATION: 6 weeks  PLANNED INTERVENTIONS: Therapeutic exercises, Therapeutic activity, Neuromuscular re-education, Balance training, Gait training, Patient/Family education, Self Care, Manual therapy, and Re-evaluation  PLAN FOR NEXT SESSION: initial HEP with emphasis on balance and walking/cardiovascular endurance, follow up about cardiology results/testing, AD trial with 2WW continued  Carmelia Bake, PT, DPT 12/05/2022, 4:26 PM

## 2022-12-06 ENCOUNTER — Ambulatory Visit: Payer: Medicare Other | Admitting: Physical Therapy

## 2022-12-06 ENCOUNTER — Ambulatory Visit: Payer: Medicare Other | Admitting: Cardiology

## 2022-12-06 VITALS — BP 128/77 | HR 75

## 2022-12-06 DIAGNOSIS — R269 Unspecified abnormalities of gait and mobility: Secondary | ICD-10-CM | POA: Diagnosis not present

## 2022-12-06 DIAGNOSIS — R2681 Unsteadiness on feet: Secondary | ICD-10-CM

## 2022-12-06 NOTE — Therapy (Signed)
OUTPATIENT PHYSICAL THERAPY NEURO TREATMENT   Patient Name: Gregory Perez MRN: [id number] DOB:Jul 05, 1941, 82 y.o., male Today's Date: 12/06/2022   PCP: [name], DO REFERRING PROVIDER: Dohmeier, Porfirio Mylar, MD  END OF SESSION:  PT End of Session - 12/06/22 0932     Visit Number 5    Date for PT Re-Evaluation 01/06/23    Authorization Type Medicare and [employer] Secondary    PT Start Time 0930    PT Stop Time 1016    PT Time Calculation (min) 46 min    Equipment Utilized During Treatment Gait belt    Activity Tolerance Patient tolerated treatment well    Behavior During Therapy WFL for tasks assessed/performed            Past Medical History:  Diagnosis Date   HLD (hyperlipidemia)    Hypertension    Hypokalemia    Loop Recorder Biomonitor III 11/20/2022 11/21/2022   Major depression    No past surgical history on file. Patient Active Problem List   Diagnosis Date Noted   Loop Recorder Biomonitor III 11/20/2022 11/21/2022   Coronary artery disease involving native coronary artery of native heart without angina pectoris 11/18/2022   Syncope and collapse 11/02/2022   Abnormal electrocardiogram 10/23/2022   Exertional dyspnea 10/23/2022   Pain in right paraspinal region 10/16/2022   Spinal stenosis of lumbar region without neurogenic claudication 10/16/2022   Strain of right hip adductor muscle 10/16/2022   PSVT (paroxysmal supraventricular tachycardia) 06/19/2022   Agatston coronary artery calcium score between 100 and 400 06/19/2022   Essential hypertension 02/13/2022   Hypertensive heart disease without heart failure 02/13/2022   Abnormal stress ECG with treadmill 08/16/2021   Palpitations 05/04/2021   Encounter for counseling on use of bilevel positive airway pressure (BiPAP) therapy 04/12/2020   Controlled REM sleep behavior disorder 04/12/2020   Chronic intermittent hypoxia with obstructive sleep apnea 01/10/2020   REM sleep behavior disorder  12/16/2019   Abnormal dreams 12/16/2019    ONSET DATE: 11/06/2022 (referral date)  REFERRING DIAG: M54.89 (ICD-10-CM) - Pain in right paraspinal region M48.061 (ICD-10-CM) - Spinal stenosis of lumbar region without neurogenic claudication S76.011A (ICD-10-CM) - Strain of right hip adductor muscle, initial encounter R29.6 (ICD-10-CM) - Frequent falls  THERAPY DIAG:  Abnormality of gait and mobility  Unsteadiness on feet  Rationale for Evaluation and Treatment: Rehabilitation  SUBJECTIVE:                                                                                                                                                                                             SUBJECTIVE STATEMENT: Patient arrives to  session with his wife Gregory Perez. Patient requests if therapist could review previous conversations with wife. Patient also reports that he received call back from PCP who agreed to do a MRI of the brain. Patient denies any falls/near falls. Was not able to look to see if they had a 2WW at home.  Pt accompanied by: self and significant other - wife Gregory Perez  PERTINENT HISTORY: hypertension, hyperlipidemia, elevated coronary calcium score, PSVT  PAIN:  Are you having pain? No  PRECAUTIONS: Fall - recent syncope episode (monitor vitals)  WEIGHT BEARING RESTRICTIONS: No  FALLS: Has patient fallen in last 6 months? Yes. 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 Multilevel spinal and  foraminal encroachment throughout the cervical spine as described above.   Patchy hyperintensity in the pons bilaterally most likely chronic microvascular ischemic change.  EKG 11/01/2022: Sinus rhythm 51 bpm Possible old anteroseptla infarct No significant change compared to precious EKG   EKG 07/22/2022: Sinus rhythm 54 bpm Possible old anteroseptal infarct  COGNITION: Overall cognitive status: Within functional limits for tasks assessed - reports mild word recall difficulty and minor short term changes but nothing major    TODAY'S TREATMENT:                                                                                                                               Vitals:   12/06/22 0955  BP: 128/77  Pulse: 75    TherAct:  Reviewed oculomotor findings and results with patient's spouse in addition to rationale why therapist had reached out to patient's PCP regarding findings. Also reviewed why therapist would recommend 2WW versus 4WW given level of control. Both parties verbalized understanding. Discussed options from where to purchase or go through insurance for walker. Family planning on purchasing. Discussed height adjustment as well and recommended patient bring in walker to assess.   TherEx:  Practiced all exercises on HEP 2x as noted below. Discussed safety measures including how to properly setup corner balance at home and donning blue theraband for resisted sit to stand in seated. Also provided family with a print out of the [employer] six week walking program and recommended using it instead for a recumbent biking aerobic program as patient has one at home.   - Corner Balance Feet Together: Eyes Closed With Head 2 sets - 10 reps - Tandem Stance - 3 sets - 30 hold - Sit to Stand with Resistance Around Legs  -  2 sets - 10 reps - Heel Toe Raises with Counter Support  - 2 sets - 20 reps  PATIENT EDUCATION: Education details: Initial  eval + as noted above Person educated: Patient Education method: Explanation Education comprehension: verbalized understanding and needs further education  HOME EXERCISE PROGRAM: Access Code: [email or web address] URL: [email or web address] Date: 12/06/2022 Prepared by: Maryruth Eve  Exercises - Corner Balance Feet Together: Eyes Closed With Head Turns  - 1 x daily - 7 x weekly - 3 sets - 10 reps - Tandem Stance  - 1 x daily - 7 x weekly - 3 sets - 30 hold - Sit to Stand with Resistance Around Legs  - 1 x daily - 3 x weekly - 2 sets - 10 reps - Heel Toe Raises with Counter Support  - 1 x daily - 7 x weekly - 2 sets - 20 reps  GOALS: Goals reviewed with patient? Yes  SHORT TERM GOALS: Target date: 12/02/2022  Patient will demonstrate 100% compliance with initial HEP to continue to progress between physical therapy sessions.   Baseline: To be provided  Goal status: INITIAL  2. Patient will improve their 5x Sit to Stand score to less than 12 seconds to demonstrate a decreased risk for falls and improved LE strength.   Baseline: 13.74"; improved to 11.37 sec Goal status: MET  3.  Therapist will assess miniBEST and write goal as indicated to help target balance interventions for patient's internation trip.  Baseline: Assessed on 5/14 Goal status: MET  4.  Therapist will assess and write goal as indicated to help target balance interventions for patient's internation trip.  Baseline: Assessed on 5/14 Goal status: MET  LONG TERM GOALS: Target date: 12/23/2022  Patient will report demonstrate independence with final HEP in order to maintain current gains and continue to progress after physical therapy discharge.   Baseline: To be provided Goal status: INITIAL  2.  Patient will improve gait speed to 1.1 m/s or greater to indicate a reduced risk for falls.   Baseline: 0.93 m/s without AD  (SBA) Goal status: INITIAL  3.  Patient will improve score by 50 meters to  demonstrate a clinically meaningful improvement in aerobic capacity and endurance needed for ambulating in the community.   Baseline: 336 meters Goal status: INITIAL  4. Patient will improve miniBEST score to 20 or greater to indicate a decreased risk of falls and improved static stability.  Baseline: 18 Goal status: INITIAL   ASSESSMENT:  CLINICAL IMPRESSION: Session emphasized spouse education and continued recommendation for 2WW for longer distances. Also introduced recumbent biking program and initial HEP. Patient tolerated well and MRI scheduled for further evaluation. Continue POC.  OBJECTIVE IMPAIRMENTS: Abnormal gait, decreased activity tolerance, decreased balance, decreased endurance, decreased mobility, difficulty walking, and decreased strength.   ACTIVITY LIMITATIONS: carrying, standing, squatting, and locomotion level  PARTICIPATION LIMITATIONS: occupation, yard work, and travel  PERSONAL FACTORS: Age and 3+ comorbidities: see above  are also affecting patient's functional outcome.   REHAB POTENTIAL: Good  CLINICAL DECISION MAKING: Stable/uncomplicated  EVALUATION COMPLEXITY: Low  PLAN:  PT FREQUENCY: 1-2x/week  PT DURATION: 6 weeks  PLANNED INTERVENTIONS: Therapeutic exercises, Therapeutic activity, Neuromuscular re-education, Balance training, Gait training, Patient/Family education, Self Care, Manual therapy, and Re-evaluation  PLAN FOR NEXT SESSION:  progress initial HEP with emphasis on balance and walking/cardiovascular endurance, follow up about cardiology results/testing, AD trial with 2WW continued outdoors  Carmelia Bake, PT, DPT 12/06/2022, 11:30 AM

## 2022-12-10 ENCOUNTER — Other Ambulatory Visit: Payer: Self-pay | Admitting: Internal Medicine

## 2022-12-10 DIAGNOSIS — R2681 Unsteadiness on feet: Secondary | ICD-10-CM

## 2022-12-10 NOTE — Telephone Encounter (Signed)
Called and spoke with patient, he understands that 30 days after implant, he will start receiving messages, but as of now, everything is well, per [employer]

## 2022-12-12 ENCOUNTER — Ambulatory Visit: Payer: Medicare Other | Attending: Neurology | Admitting: Physical Therapy

## 2022-12-12 ENCOUNTER — Encounter: Payer: Self-pay | Admitting: Physical Therapy

## 2022-12-12 VITALS — BP 138/77 | HR 75

## 2022-12-12 DIAGNOSIS — R2681 Unsteadiness on feet: Secondary | ICD-10-CM | POA: Insufficient documentation

## 2022-12-12 DIAGNOSIS — R269 Unspecified abnormalities of gait and mobility: Secondary | ICD-10-CM | POA: Diagnosis present

## 2022-12-12 NOTE — Therapy (Signed)
OUTPATIENT PHYSICAL THERAPY NEURO TREATMENT   Patient Name: Gregory Perez MRN: [id number] DOB:02-11-1941, 82 y.o., male Today's Date: 12/12/2022   PCP: [name], DO REFERRING PROVIDER: Dohmeier, Porfirio Mylar, MD  END OF SESSION:  PT End of Session - 12/12/22 1016     Visit Number 6    Number of Visits 10    Date for PT Re-Evaluation 01/06/23    Authorization Type Medicare and [employer] Secondary    PT Start Time 1015    PT Stop Time 1100    PT Time Calculation (min) 45 min    Equipment Utilized During Treatment Gait belt    Activity Tolerance Patient tolerated treatment well    Behavior During Therapy WFL for tasks assessed/performed            Past Medical History:  Diagnosis Date   HLD (hyperlipidemia)    Hypertension    Hypokalemia    Loop Recorder Biomonitor III 11/20/2022 11/21/2022   Major depression    History reviewed. No pertinent surgical history. Patient Active Problem List   Diagnosis Date Noted   Loop Recorder Biomonitor III 11/20/2022 11/21/2022   Coronary artery disease involving native coronary artery of native heart without angina pectoris 11/18/2022   Syncope and collapse 11/02/2022   Abnormal electrocardiogram 10/23/2022   Exertional dyspnea 10/23/2022   Pain in right paraspinal region 10/16/2022   Spinal stenosis of lumbar region without neurogenic claudication 10/16/2022   Strain of right hip adductor muscle 10/16/2022   PSVT (paroxysmal supraventricular tachycardia) 06/19/2022   Agatston coronary artery calcium score between 100 and 400 06/19/2022   Essential hypertension 02/13/2022   Hypertensive heart disease without heart failure 02/13/2022   Abnormal stress ECG with treadmill 08/16/2021   Palpitations 05/04/2021   Encounter for counseling on use of bilevel positive airway pressure (BiPAP) therapy 04/12/2020   Controlled REM sleep behavior disorder 04/12/2020   Chronic intermittent hypoxia with obstructive sleep apnea 01/10/2020    REM sleep behavior disorder 12/16/2019   Abnormal dreams 12/16/2019    ONSET DATE: 11/06/2022 (referral date)  REFERRING DIAG: M54.89 (ICD-10-CM) - Pain in right paraspinal region M48.061 (ICD-10-CM) - Spinal stenosis of lumbar region without neurogenic claudication S76.011A (ICD-10-CM) - Strain of right hip adductor muscle, initial encounter R29.6 (ICD-10-CM) - Frequent falls  THERAPY DIAG:  Abnormality of gait and mobility  Unsteadiness on feet  Rationale for Evaluation and Treatment: Rehabilitation  SUBJECTIVE:  SUBJECTIVE STATEMENT: Patient is waiting on getting MRI scheduled. Patient was able to do his band exercise and bike 3 days. Patient has not been able to do a few of the other exercises. Patient denies any falls/near falls. Arrives to session with new 2WW and is wanting to work on getting it set up. Denies falls/near falls.   Pt accompanied by: self   PERTINENT HISTORY: hypertension, hyperlipidemia, elevated coronary calcium score, PSVT  PAIN:  Are you having pain? No - mild catching in back intermittently for 5/10  PRECAUTIONS: Fall - recent syncope episode (monitor vitals)  WEIGHT BEARING RESTRICTIONS: No  FALLS: Has patient fallen in last 6 months? Yes. 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  Multilevel spinal and foraminal encroachment throughout the cervical spine as described above.   Patchy hyperintensity in the pons bilaterally most likely chronic microvascular ischemic change.  EKG 11/01/2022: Sinus rhythm 51 bpm Possible old anteroseptla infarct No significant change compared to precious EKG   EKG 07/22/2022: Sinus rhythm 54 bpm Possible old anteroseptal infarct  COGNITION: Overall cognitive status: Within functional limits for tasks assessed - reports mild word recall difficulty and minor short term changes but nothing major   TODAY'S TREATMENT:                                                                                                                               Vitals:   12/12/22 1023 12/12/22 1057  BP: 129/76 138/77  Pulse: 69 75  SpO2:  99%    TherAct:  Assisted patient with setup of 2WW to proper height and added tennis balls. As patient was between handle setting, trialed two different height of walker 1 x 115' with SBA; went with taller of two settings as improved upright posture without compromising upper trap engagement.   Educated patient on obstacle weaving with 2WW and transfers to and from chair. Patient had episode of legs crossing midline and weaving. Improved with repition. 6 x 5 cones with 2WW + SBA  CURB:  Level of Assistance: Modified independence and SBA Assistive device utilized: Environmental consultant - 2 wheeled Curb Comments: Progressed from SBA to modI   NMR:  Rocker board anterior/posterior (CGA) EO 3 x 30" Vertical head turns x 10 Horizontal head turns x 10 Step back return with pertubation for reactive balance x 10 Rocker board laterally EO 3 x 30" Vertical head turns x 10 Horizontal head turns x 10 Patient intermittently used UE  between // bars to stabilize. Greatest posterior LOB. Patient reported brief episode of feeling lightheaded/dizzy at end of session. Assessed vitals. BP slightly elevated but otherwise all vitals WNL.  Improved with seated break.    PATIENT EDUCATION: Education details: Continue HEP + recommended use of 2WW Person educated: Patient Education method: Explanation Education comprehension: verbalized understanding and needs further education  HOME EXERCISE PROGRAM: Access Code: [email or web address] URL: [email or web address] Date: 12/06/2022 Prepared by: Maryruth Eve  Exercises - Corner Balance Feet Together: Eyes Closed With Head Turns  - 1 x daily - 7 x weekly - 3 sets - 10 reps - Tandem Stance  - 1 x daily - 7 x weekly - 3 sets - 30 hold - Sit to Stand with Resistance Around Legs  - 1 x daily - 3 x weekly - 2 sets - 10 reps - Heel Toe Raises with Counter Support  - 1 x daily - 7 x weekly - 2 sets - 20 reps  GOALS: Goals reviewed with patient? Yes  SHORT TERM GOALS: Target date: 12/02/2022  Patient will demonstrate 100% compliance with initial HEP to continue to progress between physical therapy sessions.   Baseline: To be provided  Goal status: INITIAL  2. Patient will improve their 5x Sit to Stand score to less than 12 seconds to demonstrate a decreased risk for falls and improved LE strength.   Baseline: 13.74"; improved to 11.37 sec Goal status: MET  3.  Therapist will assess miniBEST and write goal as indicated to help target balance interventions for patient's internation trip.  Baseline: Assessed on 5/14 Goal status: MET  4.  Therapist will assess and write goal as indicated to help target balance interventions for patient's internation trip.  Baseline: Assessed on 5/14 Goal status: MET  LONG TERM GOALS: Target date: 12/23/2022  Patient will report demonstrate independence with final HEP in order to maintain current gains and continue to progress after physical therapy discharge.   Baseline: To be provided Goal status: INITIAL  2.  Patient will improve gait speed to 1.1 m/s or greater to indicate a reduced risk for falls.   Baseline: 0.93 m/s without AD   (SBA) Goal status: INITIAL  3.  Patient will improve score by 50 meters to demonstrate a clinically meaningful improvement in aerobic capacity and endurance needed for ambulating in the community.   Baseline: 336 meters Goal status: INITIAL  4. Patient will improve miniBEST score to 20 or greater to indicate a decreased risk of falls and improved static stability.  Baseline: 18 Goal status: INITIAL   ASSESSMENT:  CLINICAL IMPRESSION: Session emphasized adjustment/education with patient's new 2WW. Educated on safety with transfers and navigation in complex environment. Worked on stability between // bars. Patient greatest challenge with retropulsion. Improved with repetition. Reported feeling slightly lightheaded / dizzy at end of session. Improved with seated break. Mild elevation in BP but otherwise findings WNL. Advised patient to follow up with PCP if symptoms were to return. Continue POC.  OBJECTIVE IMPAIRMENTS: Abnormal gait, decreased activity tolerance, decreased balance, decreased endurance, decreased mobility, difficulty walking, and decreased strength.   ACTIVITY LIMITATIONS: carrying, standing, squatting, and locomotion level  PARTICIPATION LIMITATIONS: occupation, yard work, and travel  PERSONAL FACTORS: Age and 3+ comorbidities: see above  are also affecting patient's functional outcome.   REHAB POTENTIAL: Good  CLINICAL DECISION MAKING: Stable/uncomplicated  EVALUATION COMPLEXITY: Low  PLAN:  PT FREQUENCY: 1-2x/week  PT DURATION: 6 weeks  PLANNED INTERVENTIONS: Therapeutic exercises, Therapeutic activity, Neuromuscular re-education,  Balance training, Gait training, Patient/Family education, Self Care, Manual therapy, and Re-evaluation  PLAN FOR NEXT SESSION:  progress initial HEP with emphasis on balance and walking/cardiovascular endurance, follow up about cardiology results/testing, reactive stepping, modified SLS tasks  Carmelia Bake, PT, DPT 12/12/2022,  12:24 PM

## 2022-12-13 ENCOUNTER — Ambulatory Visit: Payer: Medicare Other | Admitting: Physical Therapy

## 2022-12-13 ENCOUNTER — Encounter: Payer: Self-pay | Admitting: Physical Therapy

## 2022-12-13 VITALS — BP 124/76 | HR 72

## 2022-12-13 DIAGNOSIS — R269 Unspecified abnormalities of gait and mobility: Secondary | ICD-10-CM | POA: Diagnosis not present

## 2022-12-13 DIAGNOSIS — R2681 Unsteadiness on feet: Secondary | ICD-10-CM

## 2022-12-13 NOTE — Therapy (Signed)
OUTPATIENT PHYSICAL THERAPY NEURO TREATMENT   Patient Name: Gregory Perez MRN: [id number] DOB:10/16/1940, 82 y.o., male Today's Date: 12/13/2022   PCP: [name], DO REFERRING PROVIDER: Dohmeier, Porfirio Mylar, MD  END OF SESSION:  PT End of Session - 12/13/22 1153     Visit Number 7    Number of Visits 10    Date for PT Re-Evaluation 01/06/23    Authorization Type Medicare and [employer] Secondary    PT Start Time 1150    PT Stop Time 1230    PT Time Calculation (min) 40 min    Equipment Utilized During Treatment Gait belt    Activity Tolerance Patient tolerated treatment well    Behavior During Therapy WFL for tasks assessed/performed            Past Medical History:  Diagnosis Date   HLD (hyperlipidemia)    Hypertension    Hypokalemia    Loop Recorder Biomonitor III 11/20/2022 11/21/2022   Major depression    History reviewed. No pertinent surgical history. Patient Active Problem List   Diagnosis Date Noted   Loop Recorder Biomonitor III 11/20/2022 11/21/2022   Coronary artery disease involving native coronary artery of native heart without angina pectoris 11/18/2022   Syncope and collapse 11/02/2022   Abnormal electrocardiogram 10/23/2022   Exertional dyspnea 10/23/2022   Pain in right paraspinal region 10/16/2022   Spinal stenosis of lumbar region without neurogenic claudication 10/16/2022   Strain of right hip adductor muscle 10/16/2022   PSVT (paroxysmal supraventricular tachycardia) 06/19/2022   Agatston coronary artery calcium score between 100 and 400 06/19/2022   Essential hypertension 02/13/2022   Hypertensive heart disease without heart failure 02/13/2022   Abnormal stress ECG with treadmill 08/16/2021   Palpitations 05/04/2021   Encounter for counseling on use of bilevel positive airway pressure (BiPAP) therapy 04/12/2020   Controlled REM sleep behavior disorder 04/12/2020   Chronic intermittent hypoxia with obstructive sleep apnea 01/10/2020    REM sleep behavior disorder 12/16/2019   Abnormal dreams 12/16/2019    ONSET DATE: 11/06/2022 (referral date)  REFERRING DIAG: M54.89 (ICD-10-CM) - Pain in right paraspinal region M48.061 (ICD-10-CM) - Spinal stenosis of lumbar region without neurogenic claudication S76.011A (ICD-10-CM) - Strain of right hip adductor muscle, initial encounter R29.6 (ICD-10-CM) - Frequent falls  THERAPY DIAG:  Abnormality of gait and mobility  Unsteadiness on feet  Rationale for Evaluation and Treatment: Rehabilitation  SUBJECTIVE:  SUBJECTIVE STATEMENT: Patient arrives session with 2WW and Denies falls/near falls.   Pt accompanied by: self   PERTINENT HISTORY: hypertension, hyperlipidemia, elevated coronary calcium score, PSVT  PAIN:  Are you having pain? No - mild catching in back intermittently for 5/10  PRECAUTIONS: Fall - recent syncope episode (monitor vitals)  WEIGHT BEARING RESTRICTIONS: No  FALLS: Has patient fallen in last 6 months? Yes. 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 Multilevel spinal and foraminal encroachment throughout the cervical spine as described above.   Patchy hyperintensity in the pons bilaterally most likely chronic microvascular ischemic change.  EKG 11/01/2022: Sinus rhythm 51  bpm Possible old anteroseptla infarct No significant change compared to precious EKG   EKG 07/22/2022: Sinus rhythm 54 bpm Possible old anteroseptal infarct  COGNITION: Overall cognitive status: Within functional limits for tasks assessed - reports mild word recall difficulty and minor short term changes but nothing major   TODAY'S TREATMENT:                                                                                                                               Vitals:   12/13/22 1157  BP: 124/76  Pulse: 72    TherAct:  Attempted but turned into 8 min (Timer messed up during initial 3 minutes so time estimated) ~860 feet of ambulation with 2WW (only one instance of foot scuffing, improved stability, required intermittent cues to improve upright posture)  NMR:  Rocker board anterior/posterior (CGA) EO 3 x 30" rocking forward and back to end range (intermittent grabbing UE bars) EC balance 6 x 5-20" between // bars with intermittent correction with UE support, transitioned to implementing step back strategy (intermittent step back strategy utilized at end of session) 6 Blaze pods on random setting for improved modified SLS stability.  Performed on 1 minute intervals with 30 rest periods.  Pt requires CGA guarding. Performed with patient standing on foam pad with 4 pods on ground and 2 on mirror.  Round 1:  random setup.  16 hits. Round 2:  random setup.  28 hits. Round 3:  random setup.  32 hits.   PATIENT EDUCATION: Education details: Continue HEP Person educated: Patient Education method: Explanation Education comprehension: verbalized understanding and needs further education  HOME EXERCISE PROGRAM: Access Code: [email or web address] URL: [email or web address] Date: 12/06/2022 Prepared by: Maryruth Eve  Exercises - Corner Balance Feet Together: Eyes Closed With Head Turns  - 1 x daily - 7  x weekly - 3 sets - 10 reps - Tandem Stance  - 1 x daily - 7 x weekly -  3 sets - 30 hold - Sit to Stand with Resistance Around Legs  - 1 x daily - 3 x weekly - 2 sets - 10 reps - Heel Toe Raises with Counter Support  - 1 x daily - 7 x weekly - 2 sets - 20 reps  GOALS: Goals reviewed with patient? Yes  SHORT TERM GOALS: Target date: 12/02/2022  Patient will demonstrate 100% compliance with initial HEP to continue to progress between physical therapy sessions.   Baseline: To be provided  Goal status: INITIAL  2. Patient will improve their 5x Sit to Stand score to less than 12 seconds to demonstrate a decreased risk for falls and improved LE strength.   Baseline: 13.74"; improved to 11.37 sec Goal status: MET  3.  Therapist will assess miniBEST and write goal as indicated to help target balance interventions for patient's internation trip.  Baseline: Assessed on 5/14 Goal status: MET  4.  Therapist will assess and write goal as indicated to help target balance interventions for patient's internation trip.  Baseline: Assessed on 5/14 Goal status: MET  LONG TERM GOALS: Target date: 12/23/2022  Patient will report demonstrate independence with final HEP in order to maintain current gains and continue to progress after physical therapy discharge.   Baseline: To be provided Goal status: INITIAL  2.  Patient will improve gait speed to 1.1 m/s or greater to indicate a reduced risk for falls.   Baseline: 0.93 m/s without AD  (SBA) Goal status: INITIAL  3.  Patient will improve score by 50 meters to demonstrate a clinically meaningful improvement in aerobic capacity and endurance needed for ambulating in the community.   Baseline: 336 meters Goal status: INITIAL  4. Patient will improve miniBEST score to 20 or greater to indicate a decreased risk of falls and improved static stability.  Baseline: 18 Goal status: INITIAL   ASSESSMENT:  CLINICAL IMPRESSION: Session emphasized continued work on cardiorespiratory endurance and neuro re-ed with  emphasis on SLS, stepping/hip/ankle strategy. Patient demonstrated improved smoothness of step back strategy from rockerboard with repetition but continues to have primarily limitations with posterior LOB. Patient tolerated increased duration of overground gait with 2WW with only one instance of foot scuffing. Continue POC.  OBJECTIVE IMPAIRMENTS: Abnormal gait, decreased activity tolerance, decreased balance, decreased endurance, decreased mobility, difficulty walking, and decreased strength.   ACTIVITY LIMITATIONS: carrying, standing, squatting, and locomotion level  PARTICIPATION LIMITATIONS: occupation, yard work, and travel  PERSONAL FACTORS: Age and 3+ comorbidities: see above  are also affecting patient's functional outcome.   REHAB POTENTIAL: Good  CLINICAL DECISION MAKING: Stable/uncomplicated  EVALUATION COMPLEXITY: Low  PLAN:  PT FREQUENCY: 1-2x/week  PT DURATION: 6 weeks  PLANNED INTERVENTIONS: Therapeutic exercises, Therapeutic activity, Neuromuscular re-education, Balance training, Gait training, Patient/Family education, Self Care, Manual therapy, and Re-evaluation  PLAN FOR NEXT SESSION:  progress initial HEP with emphasis on balance and walking/cardiovascular endurance, follow up about cardiology results/testing, reactive stepping, modified SLS tasks, blaze pod stepping with lateral strategies  Carmelia Bake, PT, DPT 12/13/2022, 4:26 PM

## 2022-12-17 ENCOUNTER — Ambulatory Visit: Payer: Medicare Other | Admitting: Physical Therapy

## 2022-12-18 ENCOUNTER — Other Ambulatory Visit: Payer: Self-pay | Admitting: Internal Medicine

## 2022-12-18 ENCOUNTER — Ambulatory Visit (INDEPENDENT_AMBULATORY_CARE_PROVIDER_SITE_OTHER): Payer: Medicare Other | Admitting: Orthopaedic Surgery

## 2022-12-18 ENCOUNTER — Other Ambulatory Visit (INDEPENDENT_AMBULATORY_CARE_PROVIDER_SITE_OTHER): Payer: Medicare Other

## 2022-12-18 ENCOUNTER — Encounter: Payer: Self-pay | Admitting: Orthopaedic Surgery

## 2022-12-18 DIAGNOSIS — M79604 Pain in right leg: Secondary | ICD-10-CM

## 2022-12-18 DIAGNOSIS — R2681 Unsteadiness on feet: Secondary | ICD-10-CM

## 2022-12-18 DIAGNOSIS — R296 Repeated falls: Secondary | ICD-10-CM

## 2022-12-18 DIAGNOSIS — R55 Syncope and collapse: Secondary | ICD-10-CM

## 2022-12-18 NOTE — Progress Notes (Signed)
Gregory Perez comes in today due to some low back pain and hip pain that he has been having.  This is been on the right side.  However he had had an episode where he blacked out recently while he was exercising and he has a CT blood flow study I believe tomorrow of the cervical spine and neck and they talked about the possibility of an MRI at some point.  Apparently he does have a device within him and he found it was MRI compatible fortunately.  He is in physical therapy now and they are working on his posture and his back.  On my exam both his hips move smoothly and fluidly.  There is just a touch of pain over the trochanteric area on the right side.  An AP pelvis lateral the right hip shows normal-appearing hips bilaterally with normal joint space and no acute findings or cortical irregularities.  There is no pain in the groin on either side.  I do feel that the studies that he is having tomorrow are going to be quite important for him.  From an orthopedic standpoint, physical therapy is certainly important for core strengthening and posture and I did show him some stretching exercises to try as well for his low back as well as his trochanteric area.  If things worsen he will definitely let us know.

## 2022-12-19 ENCOUNTER — Ambulatory Visit: Payer: Medicare Other | Admitting: Physical Therapy

## 2022-12-19 ENCOUNTER — Ambulatory Visit
Admission: RE | Admit: 2022-12-19 | Discharge: 2022-12-19 | Disposition: A | Discharge status: Home / Self Care | Payer: Medicare Other | Source: Ambulatory Visit | Attending: Internal Medicine | Admitting: Internal Medicine

## 2022-12-19 DIAGNOSIS — R2681 Unsteadiness on feet: Secondary | ICD-10-CM

## 2022-12-19 DIAGNOSIS — R296 Repeated falls: Secondary | ICD-10-CM

## 2022-12-19 DIAGNOSIS — R55 Syncope and collapse: Secondary | ICD-10-CM

## 2022-12-19 MED ORDER — IOPAMIDOL (ISOVUE-370) INJECTION 76%
75.0000 mL | Freq: Once | INTRAVENOUS | Status: AC | PRN
  Administered 2022-12-19: 75 mL via INTRAVENOUS

## 2022-12-22 ENCOUNTER — Encounter: Payer: Self-pay | Admitting: Cardiology

## 2022-12-22 ENCOUNTER — Other Ambulatory Visit: Payer: Self-pay | Admitting: Neurology

## 2022-12-22 NOTE — Progress Notes (Signed)
I received the following statement from PT upon my return to work  : "growing concern for central pathology contributing to balance deficits. During cover/uncover test, patient presents with exotropia and also increased difficulty with gaze fixation during oculomotor screening. 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 He does have a loop recorder and sometimes this interferes with radiology work up by MRI.  I will let Gregory Gregory Perez know about this as well.  BRAIN MRI and possibly DAT scan will be needed, please order through [facility] = I see that MRI and MRA orders were already placed  at [facility] by Gregory Perez on 12-10-2022.  No report or result yet in [facility].   Gregory Novas, MD   We cannot see images of [facility] generated Tests, only their reports.

## 2022-12-24 ENCOUNTER — Ambulatory Visit: Payer: Medicare Other | Admitting: Physical Therapy

## 2022-12-24 ENCOUNTER — Encounter: Payer: Self-pay | Admitting: Physical Therapy

## 2022-12-24 ENCOUNTER — Other Ambulatory Visit: Payer: Self-pay | Admitting: Internal Medicine

## 2022-12-24 VITALS — BP 122/77 | HR 73

## 2022-12-24 DIAGNOSIS — R269 Unspecified abnormalities of gait and mobility: Secondary | ICD-10-CM

## 2022-12-24 DIAGNOSIS — R2681 Unsteadiness on feet: Secondary | ICD-10-CM

## 2022-12-24 NOTE — Therapy (Unsigned)
OUTPATIENT PHYSICAL THERAPY NEURO TREATMENT   Patient Name: Gregory Perez MRN: [id number] DOB:03/12/1941, 82 y.o., male Today's Date: 12/26/2022   PCP: [name], DO REFERRING PROVIDER: Dohmeier, Porfirio Mylar, MD  END OF SESSION:   Past Medical History:  Diagnosis Date   HLD (hyperlipidemia)    Hypertension    Hypokalemia    Loop Recorder Biomonitor III 11/20/2022 11/21/2022   Major depression    History reviewed. No pertinent surgical history. Patient Active Problem List   Diagnosis Date Noted   Loop Recorder Biomonitor III 11/20/2022 11/21/2022   Coronary artery disease involving native coronary artery of native heart without angina pectoris 11/18/2022   Syncope and collapse 11/02/2022   Abnormal electrocardiogram 10/23/2022   Exertional dyspnea 10/23/2022   Pain in right paraspinal region 10/16/2022   Spinal stenosis of lumbar region without neurogenic claudication 10/16/2022   Strain of right hip adductor muscle 10/16/2022   PSVT (paroxysmal supraventricular tachycardia) 06/19/2022   Agatston coronary artery calcium score between 100 and 400 06/19/2022   Essential hypertension 02/13/2022   Hypertensive heart disease without heart failure 02/13/2022   Abnormal stress ECG with treadmill 08/16/2021   Palpitations 05/04/2021   Encounter for counseling on use of bilevel positive airway pressure (BiPAP) therapy 04/12/2020   Controlled REM sleep behavior disorder 04/12/2020   Chronic intermittent hypoxia with obstructive sleep apnea 01/10/2020   REM sleep behavior disorder 12/16/2019   Abnormal dreams 12/16/2019    ONSET DATE: 11/06/2022 (referral date)  REFERRING DIAG: M54.89 (ICD-10-CM) - Pain in right paraspinal region M48.061 (ICD-10-CM) - Spinal stenosis of lumbar region without neurogenic claudication S76.011A (ICD-10-CM) - Strain of right hip adductor muscle, initial encounter R29.6 (ICD-10-CM) - Frequent falls  THERAPY DIAG:  Abnormality of gait and  mobility  Unsteadiness on feet  Rationale for Evaluation and Treatment: Rehabilitation  SUBJECTIVE:                                                                                                                                                                                             SUBJECTIVE STATEMENT: Patient reports one near fall since he was last here. Patient reports one near fall in shower and slips when he gets out and hit his back. Did not hit his head. Patient is not walking with the walker. Imaging CT updates noted below.   Pt accompanied by: self   PERTINENT HISTORY: hypertension, hyperlipidemia, elevated coronary calcium score, PSVT  PAIN:  Are you having pain? No - reports non currently, mild catching in back intermittently for 6/10  PRECAUTIONS: Fall - recent syncope episode (monitor vitals)  WEIGHT BEARING RESTRICTIONS: No  FALLS: Has  patient fallen in last 6 months? Yes. 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 Multilevel spinal and foraminal encroachment throughout the cervical spine as described above.   Patchy hyperintensity in the pons bilaterally most likely chronic microvascular ischemic change.  EKG 11/01/2022: Sinus rhythm 51 bpm Possible old anteroseptla infarct No significant change compared to precious EKG   EKG 07/22/2022: Sinus rhythm 54 bpm Possible old anteroseptal infarct  CT 12/19/2022: MPRESSION: 1. No acute CT finding. Age related volume loss.  Moderate chronic small-vessel ischemic changes of the cerebral hemispheric white matter. 2. Aortic atherosclerosis. 3. Minimal atherosclerotic change at both carotid bifurcations but no stenosis. 4. No intracranial large or medium vessel occlusion or correctable proximal stenosis. 5. No posterior circulation abnormality of significance. No vascular cause of the patient's presenting symptoms is identified.  COGNITION: Overall cognitive status: Within functional limits for tasks assessed - reports mild word recall difficulty and minor short term changes but nothing major   TODAY'S TREATMENT:                                                                                                                               Vitals:   12/24/22 1547  BP: 122/77  Pulse: 73   NMR: 6" hurdle step overs 6 x 10' x 5 hurdles without UE support fwd (min-CGA) 6" hurdle step overs 6 x 10' x 5 hurdles without UE support lateral (min-CGA) 6" hurdle step working on quarter turns 10 x 3 hurdles with CGA-minA (calling out random direction) Step up onto 6" step down working on modified SLS stability and stepping reaction 2 x 10 Dynamic gait in gym with complex navigation 1 x 115' (patient continues to speed up with increased duration)   PATIENT EDUCATION: Education details: Continue HEP Person educated: Patient Education method: Explanation Education comprehension: verbalized understanding and needs further education  HOME EXERCISE PROGRAM: Access Code: [email or web address] URL: [email or web address] Date: 12/06/2022 Prepared by: Maryruth Eve  Exercises - Corner Balance Feet Together: Eyes Closed With Head Turns  - 1 x daily - 7 x weekly - 3 sets - 10 reps - Tandem Stance  - 1 x daily - 7 x weekly - 3 sets - 30 hold - Sit to Stand with Resistance Around Legs  - 1 x daily - 3 x weekly - 2 sets - 10 reps - Heel Toe Raises with Counter Support  - 1 x daily - 7 x weekly - 2 sets - 20  reps  GOALS: Goals reviewed with patient? Yes  SHORT TERM GOALS: Target date: 12/02/2022  Patient will demonstrate 100% compliance with initial HEP to continue to progress between physical therapy sessions.   Baseline: To be provided  Goal status: INITIAL  2. Patient will improve their 5x Sit to Stand score to less than 12 seconds to demonstrate a decreased risk for falls and improved LE strength.   Baseline: 13.74"; improved to 11.37 sec Goal status: MET  3.  Therapist will assess miniBEST and write goal as indicated to help target balance interventions for patient's internation trip.  Baseline: Assessed on 5/14 Goal status: MET  4.  Therapist will assess and write goal as indicated to help target balance interventions for patient's internation trip.  Baseline: Assessed on 5/14 Goal status: MET  LONG TERM GOALS: Target date: 12/23/2022  Patient will report demonstrate independence with final HEP in order to maintain current gains and continue to progress after physical therapy discharge.   Baseline: To be provided Goal status: INITIAL  2.  Patient will improve gait speed to 1.1 m/s or greater to indicate a reduced risk for falls.   Baseline: 0.93 m/s without AD  (SBA) Goal status: INITIAL  3.  Patient will improve score by 50 meters to demonstrate a clinically meaningful improvement in aerobic capacity and endurance needed for ambulating in the community.   Baseline: 336 meters Goal status: INITIAL  4. Patient will improve miniBEST score to 20 or greater to indicate a decreased risk of falls and improved static stability.  Baseline: 18 Goal status: INITIAL   ASSESSMENT:  CLINICAL IMPRESSION: Session emphasized single leg stance stability tasks. Patient demonstrates need for intermittent breaks and intermittent cross over steps and poor foot placement/stepping strategy with fatigue. Improves with seated breaks and slowed pacing. Continue to recommend AD at this  time. Continue POC.  OBJECTIVE IMPAIRMENTS: Abnormal gait, decreased activity tolerance, decreased balance, decreased endurance, decreased mobility, difficulty walking, and decreased strength.   ACTIVITY LIMITATIONS: carrying, standing, squatting, and locomotion level  PARTICIPATION LIMITATIONS: occupation, yard work, and travel  PERSONAL FACTORS: Age and 3+ comorbidities: see above  are also affecting patient's functional outcome.   REHAB POTENTIAL: Good  CLINICAL DECISION MAKING: Stable/uncomplicated  EVALUATION COMPLEXITY: Low  PLAN:  PT FREQUENCY: 1-2x/week  PT DURATION: 6 weeks  PLANNED INTERVENTIONS: Therapeutic exercises, Therapeutic activity, Neuromuscular re-education, Balance training, Gait training, Patient/Family education, Self Care, Manual therapy, and Re-evaluation  PLAN FOR NEXT SESSION:  progress initial HEP with emphasis on balance and walking/cardiovascular endurance, follow up about cardiology results/testing, reactive stepping, modified SLS tasks, blaze pod stepping with lateral strategies, outdoor/resistive gait, add visits or D/C next session?  Carmelia Bake, PT, DPT 12/26/2022, 7:57 AM

## 2022-12-25 ENCOUNTER — Telehealth: Payer: Self-pay | Admitting: Neurology

## 2022-12-25 NOTE — Telephone Encounter (Signed)
medicare/[place] sup NPR sent to [facility] [phone] 

## 2022-12-25 NOTE — Addendum Note (Signed)
Addended by: Judi Cong on: 12/25/2022 08:52 AM   Modules accepted: Orders

## 2022-12-26 ENCOUNTER — Encounter: Payer: Self-pay | Admitting: Physical Therapy

## 2022-12-26 ENCOUNTER — Ambulatory Visit: Payer: Medicare Other | Admitting: Physical Therapy

## 2022-12-26 VITALS — BP 120/72 | HR 68

## 2022-12-26 DIAGNOSIS — R269 Unspecified abnormalities of gait and mobility: Secondary | ICD-10-CM | POA: Diagnosis not present

## 2022-12-26 DIAGNOSIS — R2681 Unsteadiness on feet: Secondary | ICD-10-CM

## 2022-12-26 NOTE — Therapy (Signed)
OUTPATIENT PHYSICAL THERAPY NEURO TREATMENT   Patient Name: Gregory Perez MRN: [id number] DOB:03/20/1941, 82 y.o., male Today's Date: 12/26/2022   PCP: [name], DO REFERRING PROVIDER: Dohmeier, Porfirio Mylar, MD  END OF SESSION:  PT End of Session - 12/26/22 1108     Visit Number 9    Number of Visits 15   requesting 5 additonal visits during recert   Date for PT Re-Evaluation 02/06/23    Authorization Type Medicare and [employer] Secondary    PT Start Time 1107    PT Stop Time 1145    PT Time Calculation (min) 38 min    Equipment Utilized During Treatment Gait belt    Activity Tolerance Patient tolerated treatment well    Behavior During Therapy WFL for tasks assessed/performed             Past Medical History:  Diagnosis Date   HLD (hyperlipidemia)    Hypertension    Hypokalemia    Loop Recorder Biomonitor III 11/20/2022 11/21/2022   Major depression    History reviewed. No pertinent surgical history. Patient Active Problem List   Diagnosis Date Noted   Loop Recorder Biomonitor III 11/20/2022 11/21/2022   Coronary artery disease involving native coronary artery of native heart without angina pectoris 11/18/2022   Syncope and collapse 11/02/2022   Abnormal electrocardiogram 10/23/2022   Exertional dyspnea 10/23/2022   Pain in right paraspinal region 10/16/2022   Spinal stenosis of lumbar region without neurogenic claudication 10/16/2022   Strain of right hip adductor muscle 10/16/2022   PSVT (paroxysmal supraventricular tachycardia) 06/19/2022   Agatston coronary artery calcium score between 100 and 400 06/19/2022   Essential hypertension 02/13/2022   Hypertensive heart disease without heart failure 02/13/2022   Abnormal stress ECG with treadmill 08/16/2021   Palpitations 05/04/2021   Encounter for counseling on use of bilevel positive airway pressure (BiPAP) therapy 04/12/2020   Controlled REM sleep behavior disorder 04/12/2020   Chronic intermittent  hypoxia with obstructive sleep apnea 01/10/2020   REM sleep behavior disorder 12/16/2019   Abnormal dreams 12/16/2019    ONSET DATE: 11/06/2022 (referral date)  REFERRING DIAG: M54.89 (ICD-10-CM) - Pain in right paraspinal region M48.061 (ICD-10-CM) - Spinal stenosis of lumbar region without neurogenic claudication S76.011A (ICD-10-CM) - Strain of right hip adductor muscle, initial encounter R29.6 (ICD-10-CM) - Frequent falls  THERAPY DIAG:  Abnormality of gait and mobility - Plan: PT plan of care cert/re-cert  Unsteadiness on feet - Plan: PT plan of care cert/re-cert  Rationale for Evaluation and Treatment: Rehabilitation  SUBJECTIVE:  SUBJECTIVE STATEMENT: Patient reports he is doing well but would like to continue PT if possible leading up to trip. Patient now has MRI scheduled. Denies any falls/near falls since last session. CT scan showed mostly age related changes per patient.   Pt accompanied by: self   PERTINENT HISTORY: hypertension, hyperlipidemia, elevated coronary calcium score, PSVT  PAIN:  Are you having pain? No   PRECAUTIONS: Fall - recent syncope episode (monitor vitals)  WEIGHT BEARING RESTRICTIONS: No  FALLS: Has patient fallen in last 6 months? Yes. 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  Multilevel spinal and foraminal encroachment throughout the cervical spine as described above.   Patchy hyperintensity in the pons bilaterally most likely chronic microvascular ischemic change.  EKG 11/01/2022: Sinus rhythm 51 bpm Possible old anteroseptla infarct No significant change compared to precious EKG   EKG 07/22/2022: Sinus rhythm 54 bpm Possible old anteroseptal infarct  CT 12/19/2022: MPRESSION: 1. No acute CT finding. Age related volume loss. Moderate chronic small-vessel ischemic changes of the cerebral hemispheric white matter. 2. Aortic atherosclerosis. 3. Minimal atherosclerotic change at both carotid bifurcations but no stenosis. 4. No intracranial large or medium vessel occlusion or correctable proximal stenosis. 5. No posterior circulation abnormality of significance. No vascular cause of the patient's presenting symptoms is identified.  COGNITION: Overall cognitive status: Within functional limits for tasks assessed - reports mild word recall difficulty and minor short term changes but nothing major   TODAY'S TREATMENT:                                                                                                                               Vitals:   12/26/22 1118  BP: 120/72  Pulse: 68    TherAct (Assessment of progress on goals):   [facility] PT Assessment - 12/26/22 0001       6 minute walk test results    Aerobic Endurance Distance Walked 1149   feet with 2WW (RPE: 8/10 with mix of CGA-SBA)     Standardized Balance Assessment   10 Meter Walk 0.87 m/s with 2WW, 0.85 m/s without AD   (SBA)     Mini-BESTest   Sit To Stand Normal: Comes to stand without use of hands and stabilizes independently.    Rise to Toes Normal: Stable for 3 s with maximum height.    Stand on one leg (left) Moderate: < 20 s    Stand on one leg (right) Moderate: <  20 s    Stand on one leg - lowest score 1    Compensatory Stepping Correction - Forward Normal: Recovers  independently with a single, large step (second realignement is allowed).    Compensatory Stepping Correction - Backward Normal: Recovers independently with a single, large step    Compensatory Stepping Correction - Left Lateral Moderate: Several steps to recover equilibrium    Compensatory Stepping Correction - Right Lateral Normal: Recovers independently with 1 step (crossover or lateral OK)    Stepping Corredtion Lateral - lowest score 1    Stance - Feet together, eyes open, firm surface  Normal: 30s    Stance - Feet together, eyes closed, foam surface  Normal: 30s    Incline - Eyes Closed Normal: Stands independently 30s and aligns with gravity    Change in Gait Speed Normal: Significantly changes walkling speed without imbalance    Walk with head turns - Horizontal Moderate: performs head turns with reduction in gait speed.    Walk with pivot turns Normal: Turns with feet close FAST (< 3 steps) with good balance.    Step over obstacles Normal: Able to step over box with minimal change of gait speed and with good balance.    Timed UP & GO with Dual Task Severe: Stops counting while walking OR stops walking while counting.    Mini-BEST total score 23            PATIENT EDUCATION: Education details: Continue POC + progress towards goals  Person educated: Patient Education method: Explanation Education comprehension: verbalized understanding and needs further education  HOME EXERCISE PROGRAM: Access Code: [email or web address] URL: [email or web address] Date: 12/06/2022 Prepared by: Maryruth Eve  Exercises - Corner Balance Feet Together: Eyes Closed With Head Turns  - 1 x daily - 7 x weekly - 3 sets - 10 reps - Tandem Stance  - 1 x daily - 7 x weekly - 3 sets - 30 hold - Sit to Stand with Resistance Around Legs  - 1 x daily - 3 x weekly - 2 sets - 10 reps - Heel Toe Raises with Counter Support  - 1 x daily - 7 x weekly - 2 sets - 20 reps  GOALS: Goals reviewed with patient?  Yes  SHORT TERM GOALS: Target date: 12/02/2022  Patient will demonstrate 100% compliance with initial HEP to continue to progress between physical therapy sessions.   Baseline: To be provided  Goal status: INITIAL  2. Patient will improve their 5x Sit to Stand score to less than 12 seconds to demonstrate a decreased risk for falls and improved LE strength.   Baseline: 13.74"; improved to 11.37 sec Goal status: MET  3.  Therapist will assess miniBEST and write goal as indicated to help target balance interventions for patient's internation trip.  Baseline: Assessed on 5/14 Goal status: MET  4.  Therapist will assess and write goal as indicated to help target balance interventions for patient's internation trip.  Baseline: Assessed on 5/14 Goal status: MET  LONG TERM GOALS: Target date: 12/23/2022  Patient will report demonstrate independence with final HEP in order to maintain current gains and continue to progress after physical therapy discharge.   Baseline: Progressing well Goal status: IN PROGRESS  2.  Patient will improve gait speed to 1.1 m/s or greater to indicate a reduced risk for falls.   Baseline: 0.93 m/s without AD  (SBA); not met but appropriate for patient's safety  Goal status: NOT MET  3.  Patient  will improve score by 50 meters to demonstrate a clinically meaningful improvement in aerobic capacity and endurance needed for ambulating in the community.   Baseline: 336 meters; improved to 350 meet with 2WW Goal status: IN PROGRESS  4. Patient will improve miniBEST score to 20 or greater to indicate a decreased risk of falls and improved static stability.  Baseline: 18; improved to 23 Goal status: MET  UPDATED LONG TERM GOALS AT RECERT: Target date: 01/22/2023  Patient will report demonstrate independence with final HEP in order to maintain current gains and continue to progress after physical therapy discharge.   Baseline: Update before D/C as  needed Goal status: INITIAL  2.  Patient will improve score by 50 meters to demonstrate a clinically meaningful improvement in aerobic capacity and endurance needed for ambulating in the community.   Baseline: 336 meters; improved to 350 meet with 2WW Goal status: INITIAL  3. Patient will improve miniBEST score on reactive balance to 2/2 for all tested (forward, backward, and lateral bilaterally) components to indicate improved stepping strategy to decrease risk of falls. Baseline: scored 1/2 on left lateral step Goal status: INTIAL  4. Patient will improve static SLS to 10" bilaterally to indicate progress towards a decreased risk for falls.   Baseline: < 9 seconds bilaterally   Goal: Initial    ASSESSMENT:  CLINICAL IMPRESSION: Session emphasized assessment of patient's progress towards goals. Patient achieved 1/5 LTGs and progressing well on 2/5. Patient did not meet gait speed goal but therapist content with progress as slower pace improves patient observed safety and reduced forward propulsion. Patient also demonstrated reduced risk for falls with progress on miniBEST and continues to demonstrate improved cardiorespiratory endurance as indicated by improvements. Continue POC.  OBJECTIVE IMPAIRMENTS: Abnormal gait, decreased activity tolerance, decreased balance, decreased endurance, decreased mobility, difficulty walking, and decreased strength.   ACTIVITY LIMITATIONS: carrying, standing, squatting, and locomotion level  PARTICIPATION LIMITATIONS: occupation, yard work, and travel  PERSONAL FACTORS: Age and 3+ comorbidities: see above  are also affecting patient's functional outcome.   REHAB POTENTIAL: Good  CLINICAL DECISION MAKING: Stable/uncomplicated  EVALUATION COMPLEXITY: Low  PLAN:  PT FREQUENCY: 1x/week  PT DURATION: 4 weeks (From today's session, requesting recert)  PLANNED INTERVENTIONS: Therapeutic exercises, Therapeutic activity, Neuromuscular  re-education, Balance training, Gait training, Patient/Family education, Self Care, Manual therapy, and Re-evaluation  PLAN FOR NEXT SESSION:  progress initial HEP with emphasis on balance and walking/cardiovascular endurance, follow up about cardiology results/testing, reactive stepping, modified SLS tasks, blaze pod stepping with lateral strategies, outdoor/resistive gait, update HEP for discharge  Carmelia Bake, PT, DPT 12/26/2022, 5:13 PM

## 2022-12-26 NOTE — Telephone Encounter (Signed)
Called the patient. He was confused about whether the MRI brain was suppose to be w and without contrast. I advised that Dr Vickey Huger did order it to be that way. He is scheduled 12/28/2022 to have that completed. Advised unless I have cancellation, we will plan to keep the apt for 6/27 for the pt. Pt verbalized understanding. Pt had no questions at this time but was encouraged to call back if questions arise.

## 2022-12-28 ENCOUNTER — Ambulatory Visit
Admission: RE | Admit: 2022-12-28 | Discharge: 2022-12-28 | Disposition: A | Discharge status: Home / Self Care | Payer: Medicare Other | Source: Ambulatory Visit | Attending: Neurology | Admitting: Neurology

## 2022-12-28 DIAGNOSIS — M5489 Other dorsalgia: Secondary | ICD-10-CM

## 2022-12-28 DIAGNOSIS — R296 Repeated falls: Secondary | ICD-10-CM

## 2022-12-28 DIAGNOSIS — M48061 Spinal stenosis, lumbar region without neurogenic claudication: Secondary | ICD-10-CM

## 2022-12-28 DIAGNOSIS — S76011A Strain of muscle, fascia and tendon of right hip, initial encounter: Secondary | ICD-10-CM

## 2022-12-28 MED ORDER — GADOPICLENOL 0.5 MMOL/ML IV SOLN
9.0000 mL | Freq: Once | INTRAVENOUS | Status: AC | PRN
  Administered 2022-12-28: 9 mL via INTRAVENOUS

## 2022-12-30 ENCOUNTER — Encounter: Payer: Self-pay | Admitting: Anesthesiology

## 2022-12-30 ENCOUNTER — Other Ambulatory Visit: Payer: Medicare Other

## 2023-01-02 ENCOUNTER — Ambulatory Visit (INDEPENDENT_AMBULATORY_CARE_PROVIDER_SITE_OTHER): Payer: Medicare Other | Admitting: Neurology

## 2023-01-02 ENCOUNTER — Encounter: Payer: Self-pay | Admitting: Neurology

## 2023-01-02 VITALS — BP 130/75 | HR 67 | Ht 69.0 in | Wt 197.0 lb

## 2023-01-02 DIAGNOSIS — R296 Repeated falls: Secondary | ICD-10-CM | POA: Insufficient documentation

## 2023-01-02 DIAGNOSIS — R404 Transient alteration of awareness: Secondary | ICD-10-CM
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
## 2023-01-03 ENCOUNTER — Ambulatory Visit: Payer: Medicare Other | Admitting: Cardiology

## 2023-01-03 ENCOUNTER — Encounter: Payer: Self-pay | Admitting: Cardiology

## 2023-01-03 VITALS — BP 129/74 | HR 64 | Ht 69.0 in | Wt 199.0 lb

## 2023-01-03 DIAGNOSIS — I471 Supraventricular tachycardia, unspecified: Secondary | ICD-10-CM

## 2023-01-03 DIAGNOSIS — R55 Syncope and collapse: Secondary | ICD-10-CM

## 2023-01-03 DIAGNOSIS — I2510 Atherosclerotic heart disease of native coronary artery without angina pectoris: Secondary | ICD-10-CM
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
## 2023-01-04 ENCOUNTER — Encounter: Payer: Self-pay | Admitting: Neurology

## 2023-01-04 ENCOUNTER — Encounter: Payer: Self-pay | Admitting: Cardiology

## 2023-01-06 ENCOUNTER — Ambulatory Visit: Payer: Medicare Other | Attending: Neurology | Admitting: Physical Therapy

## 2023-01-06 ENCOUNTER — Encounter: Payer: Self-pay | Admitting: Physical Therapy

## 2023-01-06 VITALS — BP 125/72 | HR 74

## 2023-01-06 DIAGNOSIS — R2681 Unsteadiness on feet: Secondary | ICD-10-CM | POA: Diagnosis present

## 2023-01-06 DIAGNOSIS — R269 Unspecified abnormalities of gait and mobility: Secondary | ICD-10-CM | POA: Diagnosis present

## 2023-01-06 NOTE — Telephone Encounter (Signed)
From pt

## 2023-01-06 NOTE — Telephone Encounter (Signed)
From patient.

## 2023-01-06 NOTE — Therapy (Signed)
OUTPATIENT PHYSICAL THERAPY NEURO TREATMENT / 10th Visit Progress Note   Patient Name: Gregory Perez MRN: [id number] DOB:1941-02-17, 82 y.o., male Today's Date: 01/06/2023   PCP: [name], DO REFERRING PROVIDER: Dohmeier, Porfirio Mylar, MD  END OF SESSION:  PT End of Session - 01/06/23 1409     Visit Number 10    Number of Visits 15    Date for PT Re-Evaluation 02/06/23    Authorization Type Medicare and [employer] Secondary    PT Start Time 1406    PT Stop Time 1447    PT Time Calculation (min) 41 min    Equipment Utilized During Treatment Gait belt    Activity Tolerance Patient tolerated treatment well    Behavior During Therapy WFL for tasks assessed/performed             Past Medical History:  Diagnosis Date   HLD (hyperlipidemia)    Hypertension    Hypokalemia    Loop Recorder Biomonitor III 11/20/2022 11/21/2022   Major depression    History reviewed. No pertinent surgical history. Patient Active Problem List   Diagnosis Date Noted   Transient alteration of awareness 01/02/2023   Frequent falls 01/02/2023   Loop Recorder Biomonitor III 11/20/2022 11/21/2022   Coronary artery disease involving native coronary artery of native heart without angina pectoris 11/18/2022   Syncope and collapse 11/02/2022   Abnormal electrocardiogram 10/23/2022   Exertional dyspnea 10/23/2022   Pain in right paraspinal region 10/16/2022   Spinal stenosis of lumbar region without neurogenic claudication 10/16/2022   Strain of right hip adductor muscle 10/16/2022   PSVT (paroxysmal supraventricular tachycardia) 06/19/2022   Agatston coronary artery calcium score between 100 and 400 06/19/2022   Essential hypertension 02/13/2022   Hypertensive heart disease without heart failure 02/13/2022   Abnormal stress ECG with treadmill 08/16/2021   Palpitations 05/04/2021   Encounter for counseling on use of bilevel positive airway pressure (BiPAP) therapy 04/12/2020   Controlled REM  sleep behavior disorder 04/12/2020   Chronic intermittent hypoxia with obstructive sleep apnea 01/10/2020   REM sleep behavior disorder 12/16/2019   Abnormal dreams 12/16/2019    ONSET DATE: 11/06/2022 (referral date)  REFERRING DIAG: M54.89 (ICD-10-CM) - Pain in right paraspinal region M48.061 (ICD-10-CM) - Spinal stenosis of lumbar region without neurogenic claudication S76.011A (ICD-10-CM) - Strain of right hip adductor muscle, initial encounter R29.6 (ICD-10-CM) - Frequent falls  THERAPY DIAG:  Unsteadiness on feet  Abnormality of gait and mobility  Rationale for Evaluation and Treatment: Rehabilitation  SUBJECTIVE:  SUBJECTIVE STATEMENT: Patient reports that he was not told to drive for 6 months. Patient to have an EMG tomorrow. Patient reports that he has had two instances of driving and scraping up against the side of the garage. Patient reports that he was in a fog. Patient felt like he did not have the sense of what he was working on.   Pt accompanied by: self and wife [name]  PERTINENT HISTORY: hypertension, hyperlipidemia, elevated coronary calcium score, PSVT  PAIN:  Are you having pain? No   PRECAUTIONS: Fall - recent syncope episode (monitor vitals)  WEIGHT BEARING RESTRICTIONS: No  FALLS: Has patient fallen in last 6 months? Yes. Number of falls 2 - able to get up independently   LIVING ENVIRONMENT: Lives with: lives with their spouse Lives in: House/apartment Stairs: Yes: Internal: 15 steps; can reach both Has following equipment at home: Single point cane and Shower bench  PLOF: Independent - regularly travels with wife and worked with trainer multiple days a week   PATIENT GOALS: "Be able to complete the trip with my grandson and feel like I am not an invalid."    OBJECTIVE:   DIAGNOSTIC FINDINGS:   IMPRESSION: Multilevel cervical spondylosis. Multilevel spinal and foraminal encroachment throughout the cervical spine as described above.   Patchy hyperintensity in the pons bilaterally most likely chronic microvascular ischemic change.  EKG 11/01/2022: Sinus rhythm 51 bpm Possible old anteroseptla infarct No significant change compared to precious EKG   EKG 07/22/2022: Sinus rhythm 54 bpm Possible old anteroseptal infarct  CT 12/19/2022: MPRESSION: 1. No acute CT finding. Age related volume loss. Moderate chronic small-vessel ischemic changes of the cerebral hemispheric white matter. 2. Aortic atherosclerosis. 3. Minimal atherosclerotic change at both carotid bifurcations but no stenosis. 4. No intracranial large or medium vessel occlusion or correctable proximal stenosis. 5. No posterior circulation abnormality of significance. No vascular cause of the patient's presenting symptoms is identified.  COGNITION: Overall cognitive status: Within functional limits for tasks assessed - reports mild word recall difficulty and minor short term changes but nothing major   TODAY'S TREATMENT:                                                                                                                               Vitals:   01/06/23 1415  BP: 125/72  Pulse: 74   TherAct (Assessment of Progress towards goals):  Bilateral foot up brace trial x 900 feet - patient continues to have major foot scuffing and one major trip during ambulation, use of 2WW prevents fall, would not recommend bracing at this time  Discussed need for continued activity outside of PT. Possibility of exploring silver sneaker classes/personal training for accountability.    [facility] PT Assessment - 01/06/23 0001       6 minute walk test results    Aerobic Endurance Distance Walked 275   meters with 2WW (foot up braces bil) (SBA-CGA)  PATIENT  EDUCATION: Education details: Explore exercise options for when finishing PT, not recommending foot up brace at this time, continue POC Person educated: Patient Education method: Explanation Education comprehension: verbalized understanding and needs further education  HOME EXERCISE PROGRAM: Access Code: [email or web address] URL: [email or web address] Date: 12/06/2022 Prepared by: Maryruth Eve  Exercises - Corner Balance Feet Together: Eyes Closed With Head Turns  - 1 x daily - 7 x weekly - 3 sets - 10 reps - Tandem Stance  - 1 x daily - 7 x weekly - 3 sets - 30 hold - Sit to Stand with Resistance Around Legs  - 1 x daily - 3 x weekly - 2 sets - 10 reps - Heel Toe Raises with Counter Support  - 1 x daily - 7 x weekly - 2 sets - 20 reps  GOALS: Goals reviewed with patient? Yes  SHORT TERM GOALS: Target date: 12/02/2022  Patient will demonstrate 100% compliance with initial HEP to continue to progress between physical therapy sessions.   Baseline: To be provided  Goal status: INITIAL  2. Patient will improve their 5x Sit to Stand score to less than 12 seconds to demonstrate a decreased risk for falls and improved LE strength.   Baseline: 13.74"; improved to 11.37 sec Goal status: MET  3.  Therapist will assess miniBEST and write goal as indicated to help target balance interventions for patient's internation trip.  Baseline: Assessed on 5/14 Goal status: MET  4.  Therapist will assess and write goal as indicated to help target balance interventions for patient's internation trip.  Baseline: Assessed on 5/14 Goal status: MET  LONG TERM GOALS: Target date: 12/23/2022  Patient will report demonstrate independence with final HEP in order to maintain current gains and continue to progress after physical therapy discharge.   Baseline: Progressing well Goal status: IN PROGRESS  2.  Patient will improve gait speed to 1.1 m/s or greater to indicate a reduced risk for falls.    Baseline: 0.93 m/s without AD  (SBA); not met but appropriate for patient's safety  Goal status: NOT MET  3.  Patient will improve score by 50 meters to demonstrate a clinically meaningful improvement in aerobic capacity and endurance needed for ambulating in the community.   Baseline: 336 meters; improved to 350 meet with 2WW Goal status: IN PROGRESS  4. Patient will improve miniBEST score to 20 or greater to indicate a decreased risk of falls and improved static stability.  Baseline: 18; improved to 23 Goal status: MET  UPDATED LONG TERM GOALS AT RECERT: Target date: 01/22/2023  Patient will report demonstrate independence with final HEP in order to maintain current gains and continue to progress after physical therapy discharge.   Baseline: Update before D/C as needed Goal status: INITIAL  2.  Patient will improve score by 50 meters to demonstrate a clinically meaningful improvement in aerobic capacity and endurance needed for ambulating in the community.   Baseline: 336 meters; improved to 350 meet with 2WW; 275 meters with foot up braces Goal status: NOTMET   3. Patient will improve miniBEST score on reactive balance to 2/2 for all tested (forward, backward, and lateral bilaterally) components to indicate improved stepping strategy to decrease risk of falls. Baseline: scored 1/2 on left lateral step Goal status: INTIAL  4. Patient will improve static SLS to 10" bilaterally to indicate progress towards a decreased risk for falls.   Baseline: < 9 seconds bilaterally   Goal: Initial  ASSESSMENT:  CLINICAL IMPRESSION: Session emphasized 10th visit progress note, patient demonstrates minimal progress during today's session. Patient has not been seen for PT for 2 weeks and has not been compliant with exercise outside of PT. Therapist discussed need to follow HEP and for exploring fitness options outside of PT following upcoming discharge. Also trialed foot up bracing  which did not improve foot scuffing during session. Would not recommend foot up brace at this time. Patient verbalized understanding on importance. Continue POC.  OBJECTIVE IMPAIRMENTS: Abnormal gait, decreased activity tolerance, decreased balance, decreased endurance, decreased mobility, difficulty walking, and decreased strength.   ACTIVITY LIMITATIONS: carrying, standing, squatting, and locomotion level  PARTICIPATION LIMITATIONS: occupation, yard work, and travel  PERSONAL FACTORS: Age and 3+ comorbidities: see above  are also affecting patient's functional outcome.   REHAB POTENTIAL: Good  CLINICAL DECISION MAKING: Stable/uncomplicated  EVALUATION COMPLEXITY: Low  PLAN:  PT FREQUENCY: 1x/week  PT DURATION: 4 weeks (From today's session, requesting recert)  PLANNED INTERVENTIONS: Therapeutic exercises, Therapeutic activity, Neuromuscular re-education, Balance training, Gait training, Patient/Family education, Self Care, Manual therapy, and Re-evaluation  PLAN FOR NEXT SESSION: assess goals, plan for D/C next session   Carmelia Bake, PT, DPT 01/06/2023, 5:01 PM

## 2023-01-07 ENCOUNTER — Ambulatory Visit (INDEPENDENT_AMBULATORY_CARE_PROVIDER_SITE_OTHER): Payer: Medicare Other | Admitting: Neurology

## 2023-01-07 DIAGNOSIS — R4182 Altered mental status, unspecified: Secondary | ICD-10-CM | POA: Diagnosis not present

## 2023-01-07 DIAGNOSIS — R404 Transient alteration of awareness: Secondary | ICD-10-CM

## 2023-01-07 DIAGNOSIS — R296 Repeated falls: Secondary | ICD-10-CM

## 2023-01-08 ENCOUNTER — Other Ambulatory Visit: Payer: Medicare Other | Admitting: *Deleted

## 2023-01-08 ENCOUNTER — Other Ambulatory Visit: Payer: Self-pay | Admitting: Cardiology

## 2023-01-13 ENCOUNTER — Encounter: Payer: Self-pay | Admitting: Physical Therapy

## 2023-01-13 ENCOUNTER — Ambulatory Visit: Payer: Medicare Other | Admitting: Physical Therapy

## 2023-01-13 ENCOUNTER — Telehealth: Payer: Self-pay | Admitting: Physical Therapy

## 2023-01-13 VITALS — BP 126/77 | HR 65

## 2023-01-13 DIAGNOSIS — R269 Unspecified abnormalities of gait and mobility: Secondary | ICD-10-CM

## 2023-01-13 DIAGNOSIS — R2681 Unsteadiness on feet: Secondary | ICD-10-CM

## 2023-01-13 NOTE — Telephone Encounter (Signed)
Dr. Thornell Mule,  Gregory Perez was discharged from physical therapy today. During our session, patient reported some ongoing depression particular surrounding recent life changes given physical status. We discussed with patient and wife possibility of exploring counseling/behavior health moving forward. They both were agreeable.  If you agree, please consider placing a referral for behavioral health/counseling.   Thank you, Maryruth Eve, PT, DPT  [facility] [address] [place], [place]  [address] Phone:  [phone] Fax:  [phone]

## 2023-01-13 NOTE — Therapy (Signed)
OUTPATIENT PHYSICAL THERAPY NEURO TREATMENT / DISCHARGE   Patient Name: Gregory Perez MRN: [id number] DOB:1941/03/15, 82 y.o., male Today's Date: 01/13/2023   PCP: [name], DO REFERRING PROVIDER: Dohmeier, Porfirio Mylar, MD  END OF SESSION:  PT End of Session - 01/13/23 1236     Visit Number 11    Number of Visits 15    Date for PT Re-Evaluation 02/06/23    Authorization Type Medicare and [employer] Secondary    PT Start Time 1235    PT Stop Time 1317    PT Time Calculation (min) 42 min    Equipment Utilized During Treatment Gait belt    Activity Tolerance Patient tolerated treatment well    Behavior During Therapy WFL for tasks assessed/performed             Past Medical History:  Diagnosis Date   HLD (hyperlipidemia)    Hypertension    Hypokalemia    Loop Recorder Biomonitor III 11/20/2022 11/21/2022   Major depression    History reviewed. No pertinent surgical history. Patient Active Problem List   Diagnosis Date Noted   Transient alteration of awareness 01/02/2023   Frequent falls 01/02/2023   Loop Recorder Biomonitor III 11/20/2022 11/21/2022   Coronary artery disease involving native coronary artery of native heart without angina pectoris 11/18/2022   Syncope and collapse 11/02/2022   Abnormal electrocardiogram 10/23/2022   Exertional dyspnea 10/23/2022   Pain in right paraspinal region 10/16/2022   Spinal stenosis of lumbar region without neurogenic claudication 10/16/2022   Strain of right hip adductor muscle 10/16/2022   PSVT (paroxysmal supraventricular tachycardia) 06/19/2022   Agatston coronary artery calcium score between 100 and 400 06/19/2022   Essential hypertension 02/13/2022   Hypertensive heart disease without heart failure 02/13/2022   Abnormal stress ECG with treadmill 08/16/2021   Palpitations 05/04/2021   Encounter for counseling on use of bilevel positive airway pressure (BiPAP) therapy 04/12/2020   Controlled REM sleep behavior  disorder 04/12/2020   Chronic intermittent hypoxia with obstructive sleep apnea 01/10/2020   REM sleep behavior disorder 12/16/2019   Abnormal dreams 12/16/2019    ONSET DATE: 11/06/2022 (referral date)  REFERRING DIAG: M54.89 (ICD-10-CM) - Pain in right paraspinal region M48.061 (ICD-10-CM) - Spinal stenosis of lumbar region without neurogenic claudication S76.011A (ICD-10-CM) - Strain of right hip adductor muscle, initial encounter R29.6 (ICD-10-CM) - Frequent falls  THERAPY DIAG:  Unsteadiness on feet  Abnormality of gait and mobility  Rationale for Evaluation and Treatment: Rehabilitation  SUBJECTIVE:  SUBJECTIVE STATEMENT: Patient reports that he was not told to drive for 6 months. Patient to have an EMG tomorrow. Patient reports that he has had two instances of driving and scraping up against the side of the garage. Patient reports that he was in a fog. Patient felt like he did not have the sense of what he was working on.   Pt accompanied by: self and wife [name]  PERTINENT HISTORY: hypertension, hyperlipidemia, elevated coronary calcium score, PSVT  PAIN:  Are you having pain? No   PRECAUTIONS: Fall - recent syncope episode (monitor vitals)  WEIGHT BEARING RESTRICTIONS: No  FALLS: Has patient fallen in last 6 months? Yes. Number of falls 2 - able to get up independently   LIVING ENVIRONMENT: Lives with: lives with their spouse Lives in: House/apartment Stairs: Yes: Internal: 15 steps; can reach both Has following equipment at home: Single point cane and Shower bench  PLOF: Independent - regularly travels with wife and worked with trainer multiple days a week   PATIENT GOALS: "Be able to complete the trip with my grandson and feel like I am not an invalid."   OBJECTIVE:    DIAGNOSTIC FINDINGS:   IMPRESSION: Multilevel cervical spondylosis. Multilevel spinal and foraminal encroachment throughout the cervical spine as described above.   Patchy hyperintensity in the pons bilaterally most likely chronic microvascular ischemic change.  EKG 11/01/2022: Sinus rhythm 51 bpm Possible old anteroseptla infarct No significant change compared to precious EKG   EKG 07/22/2022: Sinus rhythm 54 bpm Possible old anteroseptal infarct  CT 12/19/2022: MPRESSION: 1. No acute CT finding. Age related volume loss. Moderate chronic small-vessel ischemic changes of the cerebral hemispheric white matter. 2. Aortic atherosclerosis. 3. Minimal atherosclerotic change at both carotid bifurcations but no stenosis. 4. No intracranial large or medium vessel occlusion or correctable proximal stenosis. 5. No posterior circulation abnormality of significance. No vascular cause of the patient's presenting symptoms is identified.  COGNITION: Overall cognitive status: Within functional limits for tasks assessed - reports mild word recall difficulty and minor short term changes but nothing major   TODAY'S TREATMENT:                                                                                                                               Vitals:   01/13/23 1309  BP: 126/77  Pulse: 65    Self Care: Considerable portion of session spent discussing with patient and patient's wife experience with changes in function in recent month including new change in no longer being able to drive. Patient reported depression and is on medication to manage but stated that may need modification. Patient plans to reach out to PCP about medications. Patient and patient wife expressed interest in counseling/behavioral health referral when therapist introduced idea. Therapist will reach out on patient's behalf. Discussed final safety recommendations and ways in which patient's spouse can support  patient and ways in which patient can help accept these offers of  help. Continued to discuss modifications such as spouse educating on visual scanning to prevent patient cutting corners/inattention to R side with certain activities.   TherAct (Assessment of Progress towards goals):  Assessment of LTGs. Given time constraints needed to address questions above. Was unable to assess again. Will use distance from last session as final reading.   miniBEST Component Part Testing: Forward Reactive Balance: 2/2 Lateral Left Reactive Balance: 2/2 Lateral Right Reactive Balance: 2/2 Posterior Reactive Balance: 1/2  Single Leg Stance (SBA): Left: 10 seconds Right: 10 seconds   PATIENT EDUCATION: Education details: When to return to PT + continue HEP + information noted above Person educated: Patient and Spouse Education method: Explanation Education comprehension: verbalized understanding  HOME EXERCISE PROGRAM: Access Code: [email or web address] URL: [email or web address] Date: 12/06/2022 Prepared by: Maryruth Eve  Exercises - Corner Balance Feet Together: Eyes Closed With Head Turns  - 1 x daily - 7 x weekly - 3 sets - 10 reps - Tandem Stance  - 1 x daily - 7 x weekly - 3 sets - 30 hold - Sit to Stand with Resistance Around Legs  - 1 x daily - 3 x weekly - 2 sets - 10 reps - Heel Toe Raises with Counter Support  - 1 x daily - 7 x weekly - 2 sets - 20 reps  GOALS: Goals reviewed with patient? Yes  SHORT TERM GOALS: Target date: 12/02/2022  Patient will demonstrate 100% compliance with initial HEP to continue to progress between physical therapy sessions.   Baseline: To be provided  Goal status: INITIAL  2. Patient will improve their 5x Sit to Stand score to less than 12 seconds to demonstrate a decreased risk for falls and improved LE strength.   Baseline: 13.74"; improved to 11.37 sec Goal status: MET  3.  Therapist will assess miniBEST and write goal as indicated to help  target balance interventions for patient's internation trip.  Baseline: Assessed on 5/14 Goal status: MET  4.  Therapist will assess and write goal as indicated to help target balance interventions for patient's internation trip.  Baseline: Assessed on 5/14 Goal status: MET  LONG TERM GOALS: Target date: 12/23/2022  Patient will report demonstrate independence with final HEP in order to maintain current gains and continue to progress after physical therapy discharge.   Baseline: Progressing well Goal status: IN PROGRESS  2.  Patient will improve gait speed to 1.1 m/s or greater to indicate a reduced risk for falls.   Baseline: 0.93 m/s without AD  (SBA); not met but appropriate for patient's safety  Goal status: NOT MET  3.  Patient will improve score by 50 meters to demonstrate a clinically meaningful improvement in aerobic capacity and endurance needed for ambulating in the community.   Baseline: 336 meters; improved to 350 meet with 2WW Goal status: IN PROGRESS  4. Patient will improve miniBEST score to 20 or greater to indicate a decreased risk of falls and improved static stability.  Baseline: 18; improved to 23 Goal status: MET  UPDATED LONG TERM GOALS AT RECERT: Target date: 01/22/2023  Patient will report demonstrate independence with final HEP in order to maintain current gains and continue to progress after physical therapy discharge.   Baseline: Update before D/C as needed; reports confidence in HEP Goal status: MET  2.  Patient will improve score by 50 meters to demonstrate a clinically meaningful improvement in aerobic capacity and endurance needed for ambulating in the community.   Baseline: 336  meters; improved to 350 meet with 2WW; 275 meters with foot up braces Goal status: NOT MET   3. Patient will improve miniBEST score on reactive balance to 2/2 for all tested (forward, backward, and lateral bilaterally) components to indicate improved stepping  strategy to decrease risk of falls. Baseline: scored 1/2 on left lateral step; 1/2 on backward  Goal status: NOT MET  4. Patient will improve static SLS to 10" bilaterally to indicate progress towards a decreased risk for falls.   Baseline: < 9 seconds bilaterally; improved 10 seconds   Goal: Performed 10 seconds bilaterally  ASSESSMENT:  CLINICAL IMPRESSION: Patient is discharging from skilled physical therapy services at this time due to maximal rehab potential for this bout of care. Patient and patient spouse in agreement. Patient has upcoming trip to [place] and has appropriate safety in measures in place including use of WC as needed for longer distances of ambulation. Patient achieved 2/4 STGs and while did not meet stepping reaction goal continues to demonstrate improved hip/ankle strategy requiring larger pertubation before requiring reactive step strategy. Patient continues to benefit from use of 2WW to maximize safety at this time with seated rets breaks as needed given cardiovascular endurance. Patient is discharging with home exercise program to continue to maximize function outside of PT.   OBJECTIVE IMPAIRMENTS: Abnormal gait, decreased activity tolerance, decreased balance, decreased endurance, decreased mobility, difficulty walking, and decreased strength.   ACTIVITY LIMITATIONS: carrying, standing, squatting, and locomotion level  PARTICIPATION LIMITATIONS: occupation, yard work, and travel  PERSONAL FACTORS: Age and 3+ comorbidities: see above  are also affecting patient's functional outcome.   REHAB POTENTIAL: Good  CLINICAL DECISION MAKING: Stable/uncomplicated  EVALUATION COMPLEXITY: Low  PLAN:  PT FREQUENCY: 1x/week  PT DURATION: 4 weeks (From today's session, requesting recert)  PLANNED INTERVENTIONS: Therapeutic exercises, Therapeutic activity, Neuromuscular re-education, Balance training, Gait training, Patient/Family education, Self Care, Manual therapy, and  Re-evaluation  PLAN FOR NEXT SESSION: not indicated - patient D/C  Carmelia Bake, PT, DPT 01/13/2023, 2:54 PM

## 2023-01-13 NOTE — Procedures (Signed)
    History:  82 year old man with transient alteration of awareness   EEG classification: Awake and drowsy  Description of the recording: The background rhythms of this recording consists of a fairly well modulated medium amplitude alpha rhythm of 10 Hz that is reactive to eye opening and closure. Present in the anterior head region is a 15-20 Hz beta activity. Photic stimulation was performed, did not show any abnormalities. Hyperventilation was also performed, did not show any abnormalities. Drowsiness was manifested by background fragmentation. No abnormal epileptiform discharges seen during this recording. There was no focal slowing. There were no electrographic seizure identified.   Abnormality: None   Impression: This is a normal EEG recorded while drowsy and awake. No evidence of interictal epileptiform discharges. Normal EEGs, however, do not rule out epilepsy.    Windell Norfolk, MD [facility]

## 2023-01-14 ENCOUNTER — Other Ambulatory Visit: Payer: Self-pay | Admitting: Cardiology

## 2023-01-14 ENCOUNTER — Other Ambulatory Visit: Payer: Self-pay | Admitting: Neurology

## 2023-01-15 ENCOUNTER — Encounter: Payer: Self-pay | Admitting: Internal Medicine

## 2023-01-16 ENCOUNTER — Other Ambulatory Visit: Payer: Medicare Other | Admitting: *Deleted

## 2023-01-16 ENCOUNTER — Encounter: Payer: Medicare Other | Admitting: Neurology

## 2023-01-20 ENCOUNTER — Ambulatory Visit: Payer: Medicare Other | Admitting: Physical Therapy

## 2023-01-27 NOTE — Telephone Encounter (Signed)
From patient.

## 2023-02-11 ENCOUNTER — Encounter: Payer: Self-pay | Admitting: Cardiology

## 2023-02-12 NOTE — Telephone Encounter (Signed)
From patient.

## 2023-02-25 ENCOUNTER — Ambulatory Visit (INDEPENDENT_AMBULATORY_CARE_PROVIDER_SITE_OTHER): Payer: Medicare Other | Admitting: Neurology

## 2023-02-25 ENCOUNTER — Encounter: Payer: Self-pay | Admitting: Neurology

## 2023-02-25 ENCOUNTER — Telehealth: Payer: Self-pay | Admitting: Neurology

## 2023-02-25 ENCOUNTER — Other Ambulatory Visit: Payer: Self-pay | Admitting: Neurology

## 2023-02-25 VITALS — BP 134/75 | HR 63 | Ht 69.0 in | Wt 200.6 lb

## 2023-02-25 DIAGNOSIS — Z9989 Dependence on other enabling machines and devices: Secondary | ICD-10-CM | POA: Diagnosis not present

## 2023-02-25 DIAGNOSIS — G4731 Primary central sleep apnea: Secondary | ICD-10-CM | POA: Diagnosis not present

## 2023-02-25 DIAGNOSIS — R296 Repeated falls: Secondary | ICD-10-CM

## 2023-02-25 DIAGNOSIS — H53481 Generalized contraction of visual field, right eye: Secondary | ICD-10-CM

## 2023-02-25 NOTE — Telephone Encounter (Signed)
Referral for physical therapy sent through EPIC to [facility]. Phone: [phone] 
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
## 2023-02-25 NOTE — Telephone Encounter (Signed)
Referral for ophthalmology fax to [facility]. Phone: [phone], Fax: [phone]

## 2023-03-04 ENCOUNTER — Other Ambulatory Visit: Payer: Self-pay | Admitting: Cardiology

## 2023-03-04 DIAGNOSIS — R931 Abnormal findings on diagnostic imaging of heart and coronary circulation: Secondary | ICD-10-CM

## 2023-03-17 ENCOUNTER — Ambulatory Visit (INDEPENDENT_AMBULATORY_CARE_PROVIDER_SITE_OTHER): Payer: Medicare Other | Admitting: Physician Assistant

## 2023-03-17 ENCOUNTER — Encounter: Payer: Self-pay | Admitting: Physician Assistant

## 2023-03-17 DIAGNOSIS — M65341 Trigger finger, right ring finger: Secondary | ICD-10-CM

## 2023-03-17 DIAGNOSIS — M65342 Trigger finger, left ring finger: Secondary | ICD-10-CM

## 2023-03-17 MED ORDER — TRIAMCINOLONE ACETONIDE 40 MG/ML IJ SUSP
20.0000 mg | INTRAMUSCULAR | Status: AC | PRN
Start: 2023-03-17 — End: 2023-03-17
  Administered 2023-03-17: 20 mg

## 2023-03-17 MED ORDER — LIDOCAINE HCL 1 % IJ SOLN
0.5000 mL | INTRAMUSCULAR | Status: AC | PRN
Start: 2023-03-17 — End: 2023-03-17
  Administered 2023-03-17: .5 mL

## 2023-03-17 NOTE — Progress Notes (Addendum)
   Procedure Note  Patient: Gregory Perez             Date of Birth: [birth year]           MRN: [id number]             Visit Date: 03/17/2023  HPI: Mr. Vanderham returns today with left ring finger that is triggering.  We last saw him in March and he was given an injection and did well until recently.  No new injury.  He is not interested in any type of surgery.  He is asking for repeat injection.  Physical exam: left  hand active triggering of the ring finger.  Tenderness with palpable nodule over the A1 pulley.  Procedures: Visit Diagnoses:  1. Trigger finger, left ring finger     Hand/UE Inj: L ring A1 for trigger finger on 03/17/2023 4:05 PM Details: volar approach Medications: 0.5 mL lidocaine  1 %; 20 mg triamcinolone  acetonide 40 MG/ML Consent was given by the patient. Immediately prior to procedure a time out was called to verify the correct patient, procedure, equipment, support staff and site/side marked as required. Patient was prepped and draped in the usual sterile fashion.     Plan: He will follow-up with us  as needed.  He knows to wait at least 3 months between injections.

## 2023-03-18 ENCOUNTER — Ambulatory Visit: Payer: Medicare Other | Admitting: Physical Therapy

## 2023-03-19 ENCOUNTER — Encounter: Payer: Self-pay | Admitting: Cardiology

## 2023-03-20 ENCOUNTER — Encounter: Payer: Self-pay | Admitting: Neurology

## 2023-03-21 NOTE — Telephone Encounter (Signed)
Spoke with the patient. Okay to drive. You do not need to call.  Thanks MJP

## 2023-03-21 NOTE — Telephone Encounter (Signed)
From patient.

## 2023-03-25 ENCOUNTER — Ambulatory Visit: Payer: Medicare Other | Admitting: Adult Health

## 2023-03-25 ENCOUNTER — Ambulatory Visit: Payer: Medicare Other | Attending: Neurology | Admitting: Physical Therapy

## 2023-03-25 ENCOUNTER — Encounter: Payer: Self-pay | Admitting: Physical Therapy

## 2023-03-25 ENCOUNTER — Other Ambulatory Visit: Payer: Self-pay

## 2023-03-25 VITALS — BP 121/69 | HR 75

## 2023-03-25 DIAGNOSIS — Z9989 Dependence on other enabling machines and devices: Secondary | ICD-10-CM | POA: Diagnosis not present

## 2023-03-25 DIAGNOSIS — R269 Unspecified abnormalities of gait and mobility: Secondary | ICD-10-CM | POA: Insufficient documentation

## 2023-03-25 DIAGNOSIS — R296 Repeated falls: Secondary | ICD-10-CM | POA: Insufficient documentation

## 2023-03-25 DIAGNOSIS — R2681 Unsteadiness on feet: Secondary | ICD-10-CM | POA: Diagnosis present

## 2023-03-25 DIAGNOSIS — G4731 Primary central sleep apnea: Secondary | ICD-10-CM | POA: Diagnosis not present

## 2023-03-25 NOTE — Therapy (Signed)
OUTPATIENT PHYSICAL THERAPY NEURO EVALUATION   Patient Name: Gregory Perez MRN: [id number] DOB:1940-09-12, 82 y.o., male Today's Date: 03/25/2023   PCP: [name], DO REFERRING PROVIDER: Dohmeier, Porfirio Mylar, MD  END OF SESSION:  PT End of Session - 03/25/23 1404     Visit Number 1    Number of Visits 1    Date for PT Re-Evaluation 03/25/23    Authorization Type Medicare and [employer] Secondary    PT Start Time 1403    PT Stop Time 1455    PT Time Calculation (min) 52 min    Equipment Utilized During Treatment Gait belt    Activity Tolerance Patient tolerated treatment well    Behavior During Therapy WFL for tasks assessed/performed             Past Medical History:  Diagnosis Date   HLD (hyperlipidemia)    Hypertension    Hypokalemia    Loop Recorder Biomonitor III 11/20/2022 11/21/2022   Major depression    History reviewed. No pertinent surgical history. Patient Active Problem List   Diagnosis Date Noted   Transient alteration of awareness 01/02/2023   Frequent falls 01/02/2023   Loop Recorder Biomonitor III 11/20/2022 11/21/2022   Coronary artery disease involving native coronary artery of native heart without angina pectoris 11/18/2022   Syncope and collapse 11/02/2022   Abnormal electrocardiogram 10/23/2022   Exertional dyspnea 10/23/2022   Pain in right paraspinal region 10/16/2022   Spinal stenosis of lumbar region without neurogenic claudication 10/16/2022   Strain of right hip adductor muscle 10/16/2022   PSVT (paroxysmal supraventricular tachycardia) 06/19/2022   Agatston coronary artery calcium score between 100 and 400 06/19/2022   Essential hypertension 02/13/2022   Hypertensive heart disease without heart failure 02/13/2022   Abnormal stress ECG with treadmill 08/16/2021   Palpitations 05/04/2021   Encounter for counseling on use of bilevel positive airway pressure (BiPAP) therapy 04/12/2020   Controlled REM sleep behavior disorder  04/12/2020   Chronic intermittent hypoxia with obstructive sleep apnea 01/10/2020   REM sleep behavior disorder 12/16/2019   Abnormal dreams 12/16/2019    ONSET DATE: 02/25/2023 (referral date)  REFERRING DIAG: R29.6 (ICD-10-CM) - Frequent falls G47.31,Z99.89 (ICD-10-CM) - Central sleep apnea treated with adaptive servo-ventilation (ASV) dev  THERAPY DIAG:  Unsteadiness on feet - Plan: PT plan of care cert/re-cert  Abnormality of gait and mobility - Plan: PT plan of care cert/re-cert  Rationale for Evaluation and Treatment: Rehabilitation  SUBJECTIVE:  SUBJECTIVE STATEMENT: Patient known to this clinic for previous bout of PT from 12/2022-01/2023. Patient reports greatest challenges continue to be mild back discomfort and endurance deficits. Patient reports that he was surprised how well he did in his trip in [place]. Patient now cleared to drive. Patient reports overall doing well. Denies falls/near falls. Arrives to session without 2WW; report not working on HEP since last here.   Pt accompanied by: self and wife  PERTINENT HISTORY: hypertension, hyperlipidemia, elevated coronary calcium score, PSVT, chronic ischemic changes  PAIN:  Are you having pain? No and reports some pain first thing in morning and when laying down for too long  PRECAUTIONS: Fall - recent syncope episode (monitor vitals)  WEIGHT BEARING RESTRICTIONS: No  FALLS: Has patient fallen in last 6 months? 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  No acute abnormalities are noted.  No abnormal enhancement is noted.  EKG 11/01/2022: Sinus rhythm 51 bpm Possible old anteroseptla infarct No significant change compared to precious EKG   EKG 07/22/2022: Sinus rhythm 54 bpm Possible old anteroseptal infarct  COGNITION: Overall cognitive status: Within functional limits for tasks assessed - reports mild word recall difficulty and minor short term changes but nothing major and new changes since last seen   SENSATION: WFL  COORDINATION:  WFL  EDEMA:  Abscent  MUSCLE TONE: Grossly WFL bilateral LE bilaterally  POSTURE: rounded shoulders and forward head  LOWER EXTREMITY ROM:    Mild stiffness throughout hip flexors, hamstrings, and quads but WFL for activities performed during session  LOWER EXTREMITY MMT:    Strength Right Eval Left Eval  Hip flexion 4+/5 4+/5  Hip extension    Hip abduction    Hip adduction    Hip internal rotation    Hip external rotation    Knee flexion 4/5 4/5  Knee extension 4/5 4/5  Ankle dorsiflexion    Ankle plantarflexion    Ankle inversion    Ankle eversion     (Blank rows = not tested)  BED MOBILITY:  Sit to supine Complete Independence Supine to sit Complete Independence Rolling to Right Complete Independence Rolling to Left Complete Independence  TRANSFERS: Assistive device utilized: None  Sit to stand: SBA Stand to sit: SBA Chair to chair: SBA  GAIT: Gait pattern:  forward lean, reduced arm swing, poor foot clearance- Right, and poor foot clearance- Left Distance walked: 2 x 60', various clinic distances noted farthest in Assistive  device utilized: None Level of assistance: SBA, CGA, and Min A (minA with fatigue due to instability related to shuffled step and anterior LOB due to extent of forward lean)  FUNCTIONAL TESTS:     [facility] PT Assessment - 03/25/23 0001       6 minute walk test results    Aerobic Endurance Distance Walked 438   meters (1440 feet without AD, becomes extremely unsteady and  forward flexed with shuffling gait without AD, requires progression to CGA-minA to prevent fall)     Standardized Balance Assessment   Standardized Balance Assessment Five Times Sit to Stand    Five times sit to stand comments  10.16   seconds (minor retropulsion on 2 attempts)           PATIENT SURVEYS:  Not performed  TODAY'S TREATMENT:                                                                                                                              Initial Eval only  PATIENT EDUCATION: Education details: POC, examination findings, results, reviewed extensively community resources such as Education officer, environmental from [place] and HEP previously provided to continue gains Person educated: Patient Education method: Explanation Education comprehension: verbalized understanding and needs further education  HOME EXERCISE PROGRAM: *Reprinted and provided to patient from prior bout of PT  Access Code: [email or web address] URL: [email or web address] Date: 12/06/2022 Prepared by: Maryruth Eve   Exercises - Corner Balance Feet Together: Eyes Closed With Head Turns  - 1 x daily - 7 x weekly - 3 sets - 10 reps - Tandem Stance  - 1 x daily - 7 x weekly - 3 sets - 30 hold - Sit to Stand with Resistance Around Legs  - 1 x daily - 3 x weekly - 2 sets - 10 reps - Heel Toe Raises with Counter Support  - 1 x daily - 7 x weekly - 2 sets - 20 reps  GOALS: Not indicated at this time  ASSESSMENT:  CLINICAL IMPRESSION: Patient is a 82 y.o. male who was seen today for physical therapy evaluation and treatment for  imbalance with PMH of hypertension, hyperlipidemia, and PSVT. Patient was recently D/C from this facility 01/2023. Based on today's examination findings; patient is performing at/above level when last seen for PT. Patient still becomes unstable during without AD; therapist maintains recommendation of use of 2WW with ambulation over long distances to prevent this level of instability. Briefly reviewed former PT and discussed community resources for patient to follow up with for personal training versus group classes. Based on current functional status, patient is performing at baseline and no further PT services indicated at this time.  OBJECTIVE IMPAIRMENTS: Abnormal gait, decreased activity tolerance, decreased balance, decreased endurance, decreased mobility, difficulty walking, and decreased strength.   ACTIVITY LIMITATIONS: carrying, standing, squatting, and locomotion level  PARTICIPATION LIMITATIONS: yard work and travel  PERSONAL FACTORS: Age and 3+ comorbidities: see above  are also affecting patient's functional outcome.   REHAB POTENTIAL: Good  CLINICAL DECISION MAKING: Stable/uncomplicated  EVALUATION COMPLEXITY: Low  PLAN:  Further PT services not indicated at this time (see above for details)  Carmelia Bake, PT, DPT 03/25/2023, 3:14 PM

## 2023-03-26 ENCOUNTER — Encounter: Payer: Self-pay | Admitting: Cardiology

## 2023-04-22 ENCOUNTER — Encounter: Payer: Self-pay | Admitting: Cardiology

## 2023-04-22 NOTE — Telephone Encounter (Signed)
I recommended 6 month f/u at last visit in 12/2022. I don't see anything scheduled. Can you check please?  Thanks MJP

## 2023-04-30 ENCOUNTER — Other Ambulatory Visit: Payer: Self-pay | Admitting: Cardiology

## 2023-05-01 MED ORDER — SPIRONOLACTONE 25 MG PO TABS: 25.0000 mg | ORAL_TABLET | Freq: Every day | ORAL | 3 refills | Status: DC

## 2023-05-06 ENCOUNTER — Other Ambulatory Visit: Payer: Self-pay | Admitting: Cardiology

## 2023-05-12 ENCOUNTER — Ambulatory Visit (INDEPENDENT_AMBULATORY_CARE_PROVIDER_SITE_OTHER): Payer: Medicare Other

## 2023-05-12 DIAGNOSIS — R55 Syncope and collapse: Secondary | ICD-10-CM

## 2023-05-13 LAB — CUP PACEART REMOTE DEVICE CHECK
Date Time Interrogation Session: 20241105111507
Implantable Pulse Generator Implant Date: 20240515
Pulse Gen Model: 436066
Pulse Gen Serial Number: 94084164

## 2023-05-22 ENCOUNTER — Telehealth: Payer: Medicare Other | Admitting: Adult Health

## 2023-06-09 NOTE — Progress Notes (Signed)
[employer] loop Recorder

## 2023-06-16 ENCOUNTER — Ambulatory Visit: Payer: Medicare Other | Attending: Cardiology | Admitting: Cardiology

## 2023-06-16 ENCOUNTER — Encounter: Payer: Self-pay | Admitting: Cardiology

## 2023-06-16 VITALS — BP 127/76 | Resp 16 | Ht 69.0 in | Wt 202.4 lb

## 2023-06-16 DIAGNOSIS — I2510 Atherosclerotic heart disease of native coronary artery without angina pectoris: Secondary | ICD-10-CM | POA: Insufficient documentation

## 2023-06-16 DIAGNOSIS — E782 Mixed hyperlipidemia: Secondary | ICD-10-CM | POA: Insufficient documentation

## 2023-06-16 DIAGNOSIS — I10 Essential (primary) hypertension: Secondary | ICD-10-CM | POA: Diagnosis present

## 2023-06-16 NOTE — Progress Notes (Signed)
Cardiology Office Note:  .   Date:  06/16/2023  ID:  Gregory Perez, DOB 02-May-1941, MRN [id number] PCP: Gregory Ferretti, DO  [facility] Providers Cardiologist:  Gregory Mainland, MD PCP: Gregory Ferretti, DO  Chief Complaint  Patient presents with   Syncope and collapse   PSVT (paroxysmal supraventricular tachycardia)   Follow-up    6 months      History of Present Illness: .    Gregory Perez is a 82 y.o. male with hypertension, hyperlipidemia, elevated coronary calcium score, PSVT, h/o syncope   He had wonderful trips to [place] and [place], and then train trip across [place].  He really enjoyed the food, which she attributes his weight gain to.  Otherwise, he is doing well.  He has not had any syncopal or presyncopal episodes, also denies any chest pain, shortness of breath symptoms.  He was recently seen by his PCP, Lipitor was increased from 40 mg daily to 80 mg daily given LDL of 85.  Loop recorder interrogation did not show any arrhythmias.  Vitals:   06/16/23 1306  BP: 127/76  Resp: 16     ROS:  Review of Systems  Cardiovascular:  Negative for chest pain, dyspnea on exertion, leg swelling, palpitations and syncope.     Studies Reviewed: Marland Kitchen        EKG 06/16/2023: Normal sinus rhythm Inferior infarct , age undetermined  Poor R wave progression No previous ECGs available  Loop recorder interrogation 05/13/2023: LR summary report received. Battery status OK. Normal device function. No new symptom, tachy, brady, or pause episodes. No new AF episodes.     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  No acute abnormalities are noted.  No abnormal enhancement is noted.    Echocardiogram 11/18/2022: Left ventricle cavity is normal in size and wall thickness. Normal global wall motion. Normal LV systolic function with EF 59%. Indeterminate diastolic filling pattern.  Trileaflet aortic valve with aortic sclerosis. No significant stenosis or regurgitation. No evidence of pulmonary hypertension.   CTA cor 11/12/2022: 1. Total coronary calcium score of 260. This was 35th percentile for age and sex matched control. 2. Normal coronary origin with right dominance. 3. CAD-RADS = 3.   Left Main: Mild stenosis (25-49%) closer to 25% due to eccentric calcified plaque at distal left main. LAD: Mild stenosis (25-49%) due to soft plaque at the proximal LAD. Ramus: Moderate stenosis (50-69%) closer to 50% due to calcified plaque at proximal/mid segment otherwise vessel is patent. LCx: Patent. RCA: Moderate stenosis (50-69%) closer to 60% stenosis due to mixed plaque at distal RCA.   4. Aortic atherosclerosis (soft and calcified plaque). 5. Patent Foramen ovale. 6. The aortic valve is trileaflet with sclerosis (consider echocardiogram to evaluate hemodynamics).   4. Study is sent for CT-FFR to further evaluate ramus and RCA. Findings will be performed and reported separately.   CT FFR 11/13/2022: 1. Left Main: FFR = 1.0   2. LAD: Proximal FFR = 0.97, mid FFR = 0.93, distal FFR = 0.86 3. LCX: Proximal FFR = 0.97, distal FFR = 0.96 4. Ramus: Proximal FFR = 0.96, distal FFR = 0.92 5. RCA: Proximal FFR = 0.99, mid FFR =0.95, distal FFR = 0.95   IMPRESSION: 1.  CT FFR analysis showed no significant stenosis.   RECOMMENDATIONS: Goal directed medical therapy and aggressive risk factor modification for secondary prevention of coronary artery disease.     Physical Exam:   Physical Exam Vitals and nursing note reviewed.  Constitutional:      General: He is not in acute distress. Neck:     Vascular: No JVD.  Cardiovascular:     Rate and Rhythm: Normal rate and  regular rhythm.     Heart sounds: Normal heart sounds. No murmur heard. Pulmonary:     Effort: Pulmonary effort is normal.     Breath sounds: Normal breath sounds. No wheezing or rales.  Musculoskeletal:     Right lower leg: No edema.     Left lower leg: No edema.      VISIT DIAGNOSES:   ICD-10-CM   1. Syncope and collapse  R55 EKG 12-Lead    2. Coronary artery disease involving native coronary artery of native heart without angina pectoris  I25.10 Lipid panel       ASSESSMENT AND PLAN: .    Gregory Perez is a 82 y.o. male with hypertension, hyperlipidemia, elevated coronary calcium score, PSVT, h/o syncope   Syncope: Occurred in spring 2024. No clear cardiac etiology identified. Nonobstructive coronary artery disease, structurally normal heart other than incidental finding of PFO on CT scan, not noted on echocardiogram. 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  So far, no arrhythmia noted on loop recorder.     Given no recurrence, okay to resume driving.   Elevated coronary calcium score: Given LM calcificaiton and absence of bleeding, okay to continue Aspirin 81 mg daily. Continue lipitor 80 mg daily.  Check lipid panel in 3 months. Given ongoing use of aspirin 81 mg daily, I encouraged him to use Tylenol instead of Aleve for generalized pain symptoms.  Hypertension: Controlled.      F/u in 1 year  Signed, Elder Negus, MD

## 2023-06-16 NOTE — Patient Instructions (Signed)
Medication Instructions:  Your physician has recommended you make the following change in your medication:  1) STOP Aleve  *If you need a refill on your cardiac medications before your next appointment, please call your pharmacy*  Lab Work: In 3 months (around September 15, 2023): fasting Lipid panel If you have labs (blood work) drawn today and your tests are completely normal, you will receive your results only by: MyChart Message (if you have MyChart) OR A paper copy in the mail If you have any lab test that is abnormal or we need to change your treatment, we will call you to review the results.  Testing/Procedures: None ordered today.  Follow-Up: At [facility], you and your health needs are our priority.  As part of our continuing mission to provide you with exceptional heart care, we have created designated Provider Care Teams.  These Care Teams include your primary Cardiologist (physician) and Advanced Practice Providers (APPs -  Physician Assistants and Nurse Practitioners) who all work together to provide you with the care you need, when you need it.  Your next appointment:   1 year(s)  The format for your next appointment:   In Person  Provider:   Elder Negus, MD

## 2023-06-24 ENCOUNTER — Encounter: Payer: Self-pay | Admitting: Cardiology

## 2023-07-14 ENCOUNTER — Ambulatory Visit (INDEPENDENT_AMBULATORY_CARE_PROVIDER_SITE_OTHER): Payer: Medicare Other

## 2023-07-14 DIAGNOSIS — R55 Syncope and collapse: Secondary | ICD-10-CM | POA: Diagnosis not present

## 2023-07-15 LAB — CUP PACEART REMOTE DEVICE CHECK
Date Time Interrogation Session: 20250106104534
Implantable Pulse Generator Implant Date: 20240515
Pulse Gen Model: 436066
Pulse Gen Serial Number: 94084164

## 2023-08-01 ENCOUNTER — Ambulatory Visit: Payer: Medicare Other | Admitting: Cardiology

## 2023-08-08 ENCOUNTER — Encounter: Payer: Self-pay | Admitting: Cardiology

## 2023-08-11 MED ORDER — ATORVASTATIN CALCIUM 40 MG PO TABS: 40.0000 mg | ORAL_TABLET | Freq: Every day | ORAL | 3 refills | Status: DC

## 2023-08-18 ENCOUNTER — Ambulatory Visit (INDEPENDENT_AMBULATORY_CARE_PROVIDER_SITE_OTHER)

## 2023-08-18 DIAGNOSIS — R55 Syncope and collapse: Secondary | ICD-10-CM | POA: Diagnosis not present

## 2023-08-25 NOTE — Progress Notes (Signed)
 [employer]

## 2023-08-25 NOTE — Addendum Note (Signed)
Addended by: Geralyn Flash D on: 08/25/2023 10:55 AM   Modules accepted: Orders

## 2023-09-18 ENCOUNTER — Encounter: Payer: Self-pay | Admitting: Cardiology

## 2023-09-22 ENCOUNTER — Ambulatory Visit (INDEPENDENT_AMBULATORY_CARE_PROVIDER_SITE_OTHER)

## 2023-09-22 DIAGNOSIS — R55 Syncope and collapse: Secondary | ICD-10-CM | POA: Diagnosis not present

## 2023-09-22 LAB — CUP PACEART REMOTE DEVICE CHECK
Date Time Interrogation Session: 20250210112153
Date Time Interrogation Session: 20250317105854
Implantable Pulse Generator Implant Date: 20240515
Implantable Pulse Generator Implant Date: 20240515
Pulse Gen Model: 436066
Pulse Gen Model: 436066
Pulse Gen Serial Number: 94084164
Pulse Gen Serial Number: 94084164

## 2023-09-23 NOTE — Telephone Encounter (Signed)
 Please review and advise.

## 2023-09-24 ENCOUNTER — Encounter: Payer: Self-pay | Admitting: Cardiology

## 2023-09-25 NOTE — Addendum Note (Signed)
 Addended by: Geralyn Flash D on: 09/25/2023 02:47 PM   Modules accepted: Orders

## 2023-09-25 NOTE — Progress Notes (Signed)
 [employer]

## 2023-09-29 ENCOUNTER — Encounter: Payer: Self-pay | Admitting: Neurology

## 2023-10-07 LAB — LIPID PANEL
Chol/HDL Ratio: 4.3 ratio (ref 0.0–5.0)
Cholesterol, Total: 147 mg/dL (ref 100–199)
HDL: 34 mg/dL — ABNORMAL LOW (ref >39)
LDL Chol Calc (NIH): 78 mg/dL (ref 0–99)
Triglycerides: 211 mg/dL — ABNORMAL HIGH (ref 0–149)
VLDL Cholesterol Cal: 35 mg/dL (ref 5–40)

## 2023-10-07 NOTE — Progress Notes (Signed)
 Cholesterol improving, but remains above goal of LDL <70. Continue Lipitor 80 mg daily.  If patient is open to it, adding Zetia could bring LDL under adequate control.  If agreeable, recommend Zetia 10 mg. We should repeat lipid panel 3 months regardless of whether we start Zetia or not.  Thanks MJP

## 2023-10-08 ENCOUNTER — Encounter: Payer: Self-pay | Admitting: Cardiology

## 2023-10-08 DIAGNOSIS — E782 Mixed hyperlipidemia: Secondary | ICD-10-CM

## 2023-10-08 NOTE — Telephone Encounter (Signed)
 FLP was ordered and released

## 2023-10-09 ENCOUNTER — Other Ambulatory Visit: Payer: Self-pay

## 2023-10-09 MED ORDER — ATORVASTATIN CALCIUM 80 MG PO TABS: 80.0000 mg | ORAL_TABLET | Freq: Every day | ORAL | 2 refills | Status: DC

## 2023-10-09 NOTE — Progress Notes (Signed)
Please review MyChart encounter.

## 2023-10-09 NOTE — Telephone Encounter (Signed)
 We can just double up on Lipitor to 80 mg daily, without Zetia. Repeat lipid panel in 3 months.  Thanks MJP

## 2023-10-09 NOTE — Telephone Encounter (Signed)
 Contacted pt and advised labs. Pt reports that he has only been on Lipitor 40 mg. Do you want to increase the Lipitor to 80 mg and hold on the Zetia?

## 2023-10-27 ENCOUNTER — Ambulatory Visit (INDEPENDENT_AMBULATORY_CARE_PROVIDER_SITE_OTHER)

## 2023-10-27 DIAGNOSIS — R55 Syncope and collapse: Secondary | ICD-10-CM | POA: Diagnosis not present

## 2023-10-28 LAB — CUP PACEART REMOTE DEVICE CHECK
Date Time Interrogation Session: 20250421090823
Implantable Pulse Generator Implant Date: 20240515
Pulse Gen Model: 436066
Pulse Gen Serial Number: 94084164

## 2023-11-04 ENCOUNTER — Encounter: Payer: Self-pay | Admitting: Cardiology

## 2023-11-05 ENCOUNTER — Encounter: Payer: Self-pay | Admitting: Physician Assistant

## 2023-11-05 ENCOUNTER — Other Ambulatory Visit (INDEPENDENT_AMBULATORY_CARE_PROVIDER_SITE_OTHER): Payer: Self-pay

## 2023-11-05 ENCOUNTER — Ambulatory Visit (INDEPENDENT_AMBULATORY_CARE_PROVIDER_SITE_OTHER): Admitting: Physician Assistant

## 2023-11-05 DIAGNOSIS — M79644 Pain in right finger(s): Secondary | ICD-10-CM

## 2023-11-05 NOTE — Progress Notes (Signed)
 HPI: Gregory Perez comes in today due to left finger triggering.  Last injection was given September 2024.  States he does not want an injection and does not want it fixed at this point in time just mentions that it began catching about 2 months ago.  He also comes in today for right fifth finger burning sensation states has been ongoing for months.  Does not radiate to the tip of the finger is mostly at the base of the first phalanx volar side only.  Denies any triggering.  History of right ring trigger finger release done by Dr. Primus Brookes.  Had no injury recently to the right fifth finger history of old football injury and states that his finger is always kind of went to the side.  Review of systems: See HPI otherwise negative  Physical exam: General well-developed well-nourished pleasant male in no acute distress. Bilateral hands no rashes skin lesions, ulcerations, ecchymosis or erythema.  Left ring finger active triggering palpable nodule at the A1 pulley.  No other triggering fingers throughout the left hand.  Right hand no triggering however there is a palpable nodule and tenderness to the volar aspect at the A1 pulley region.  Fifth finger slightly ulnar deviation at the metacarpal phalangeal joint.  Otherwise no deformity.  No laxity with valgus varus stressing the fifth finger.  Tenderness only at the volar aspect at the A1 pulley no tenderness of the dorsal aspect of the metacarpal phalangeal joint.  Radiographs: 3 views right hand no acute fractures acute findings.  No soft tissue abnormalities.  No bony lesions.  No significant arthropathy throughout the hand.  Fifth metacarpophalangeal joint appears well-maintained.  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  Plan: Recommended use of Voltaren gel 2 g 4 times daily to the left and right fingers as needed.  Discussed left ring finger repeat injection versus surgical release.  He will follow-up  with us  as needed.  Questions encouraged and answered at length.

## 2023-11-10 NOTE — Progress Notes (Signed)
 Catering manager

## 2023-11-28 NOTE — Telephone Encounter (Signed)
 There likely is more awareness about PSA now given recent news. However, PSA result interpretation is more nuanced and I don't think I am the best person to interpret the range and various factors involved.If possible, best to get it checked with the PCP.  Thanks MJP

## 2023-12-02 ENCOUNTER — Ambulatory Visit

## 2023-12-02 DIAGNOSIS — R55 Syncope and collapse: Secondary | ICD-10-CM

## 2023-12-03 ENCOUNTER — Ambulatory Visit: Payer: Self-pay | Admitting: Cardiology

## 2023-12-03 LAB — CUP PACEART REMOTE DEVICE CHECK
Date Time Interrogation Session: 20250527082539
Implantable Pulse Generator Implant Date: 20240515
Pulse Gen Model: 436066
Pulse Gen Serial Number: 94084164

## 2023-12-16 NOTE — Addendum Note (Signed)
 Addended by: Edra Govern D on: 12/16/2023 04:53 PM   Modules accepted: Orders

## 2023-12-16 NOTE — Progress Notes (Signed)
 Carelink Summary Report / Loop Recorder

## 2023-12-19 ENCOUNTER — Ambulatory Visit: Payer: Self-pay | Admitting: Cardiology

## 2023-12-19 NOTE — Progress Notes (Signed)
 Triglycerides much improved.  LDL remains >70.  Given significant calcium  in heart arteries, recommend goal LDL <70.  He is currently on Lipitor 80 mg daily.  If patient is amenable, recommend adding Zetia 10 mg daily, which should be able to bring LDL below 70.  Thanks MJP

## 2023-12-20 ENCOUNTER — Encounter: Payer: Self-pay | Admitting: Cardiology

## 2023-12-20 DIAGNOSIS — E782 Mixed hyperlipidemia: Secondary | ICD-10-CM

## 2023-12-22 MED ORDER — EZETIMIBE 10 MG PO TABS: 10.0000 mg | ORAL_TABLET | Freq: Every day | ORAL | 3 refills | Status: DC

## 2023-12-22 NOTE — Telephone Encounter (Signed)
 Are you okay with lipid panel in 3 months?

## 2023-12-22 NOTE — Telephone Encounter (Signed)
 Yes. That would great.  Thanks MJP

## 2024-01-02 ENCOUNTER — Ambulatory Visit (INDEPENDENT_AMBULATORY_CARE_PROVIDER_SITE_OTHER)

## 2024-01-02 DIAGNOSIS — R55 Syncope and collapse: Secondary | ICD-10-CM

## 2024-01-02 LAB — CUP PACEART REMOTE DEVICE CHECK
Date Time Interrogation Session: 20250627091156
Implantable Pulse Generator Implant Date: 20240515
Pulse Gen Model: 436066
Pulse Gen Serial Number: 94084164

## 2024-01-04 ENCOUNTER — Ambulatory Visit: Payer: Self-pay | Admitting: Cardiology

## 2024-01-05 ENCOUNTER — Encounter

## 2024-01-14 ENCOUNTER — Encounter (INDEPENDENT_AMBULATORY_CARE_PROVIDER_SITE_OTHER): Payer: Self-pay

## 2024-01-15 ENCOUNTER — Encounter: Payer: Self-pay | Admitting: Cardiology

## 2024-01-16 NOTE — Telephone Encounter (Signed)
 Noted.  Thanks MJP

## 2024-01-16 NOTE — Telephone Encounter (Signed)
 Has anything showed on this patients loop recorder since last checked?

## 2024-01-21 ENCOUNTER — Encounter: Payer: Self-pay | Admitting: Cardiology

## 2024-01-22 NOTE — Progress Notes (Signed)
 [employer]

## 2024-01-22 NOTE — Addendum Note (Signed)
 Addended by: TAWNI DRILLING D on: 01/22/2024 01:19 PM   Modules accepted: Orders

## 2024-01-23 ENCOUNTER — Ambulatory Visit (INDEPENDENT_AMBULATORY_CARE_PROVIDER_SITE_OTHER): Admitting: Audiology

## 2024-01-23 ENCOUNTER — Encounter (INDEPENDENT_AMBULATORY_CARE_PROVIDER_SITE_OTHER): Payer: Self-pay | Admitting: Otolaryngology

## 2024-01-23 ENCOUNTER — Ambulatory Visit (INDEPENDENT_AMBULATORY_CARE_PROVIDER_SITE_OTHER): Admitting: Otolaryngology

## 2024-01-23 VITALS — BP 108/66 | HR 86 | Ht 69.0 in | Wt 197.0 lb

## 2024-01-23 DIAGNOSIS — H6122 Impacted cerumen, left ear: Secondary | ICD-10-CM | POA: Diagnosis not present

## 2024-01-23 DIAGNOSIS — H608X2 Other otitis externa, left ear: Secondary | ICD-10-CM

## 2024-01-23 MED ORDER — CIPROFLOXACIN-DEXAMETHASONE 0.3-0.1 % OT SUSP: 4.0000 [drp] | Freq: Two times a day (BID) | OTIC | 1 refills | Status: AC

## 2024-01-23 MED ORDER — BETAMETHASONE DIPROPIONATE 0.05 % EX CREA: TOPICAL_CREAM | Freq: Two times a day (BID) | CUTANEOUS | 0 refills | Status: DC

## 2024-01-23 NOTE — Progress Notes (Signed)
 Dear Dr. Valentin, Here is my assessment for our mutual patient, Gregory Perez. Thank you for allowing me the opportunity to care for your patient. Please do not hesitate to contact me should you have any other questions. Sincerely, Dr. Eldora Blanch  Otolaryngology Clinic Note Referring provider: Dr. Valentin HPI:  Gregory Perez is a 83 y.o. male kindly referred by Dr. 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 He reports that he has recently been having some itching in the left ear, for which he used qtips and then started to have some bleeding from the left ear canal. Itching persists, but no pain. He tried debrox and flushing the ear but no improvement. Hearing has not changed. No h/o eczema.  Patient denies: ear pain, fullness, frank vertigo, drainage Patient also denies barotrauma, vestibular suppressant use, ototoxic medication use Prior ear surgery: denies  Personal or FHx of bleeding dz or anesthesia difficulty: no  Tobacco: former, quit  PMHx: Syncope, PSVT, HLD, OSA, MDD, HTN  Independent Review of Additional Tests or Records:  Gregory Perez Referral notes reviewed and uploaded or available in chart in media tab (01/13/2024): dark blood in left ear canal, rec no q-tips; then debrox, then flushing; if no improvement, ref to ENT CTA 12/19/2022 independently interpreted with respect to ears: mastoids and ME well aerated, no otic capsule or ossicular chain abnormality noted, No large EAC lesions appreciated PMH/Meds/All/SocHx/FamHx/ROS:   Past Medical History:  Diagnosis Date   HLD (hyperlipidemia)    Hypertension    Hypokalemia    Loop Recorder Biomonitor III 11/20/2022 11/21/2022   Major depression      History reviewed. No pertinent surgical history.  History reviewed. No pertinent family history.   Social Connections: Unknown (10/18/2022)    Received from [facility]   Social Network    Social Network: Not on file      Current Outpatient Medications:    amLODipine (NORVASC) 10 MG tablet, , Disp: , Rfl:    aspirin EC 81 MG tablet, Take 81 mg by mouth daily. Swallow whole., Disp: , Rfl:    atorvastatin  (LIPITOR) 80 MG tablet, Take 1 tablet (80 mg total) by mouth daily., Disp: 90 tablet, Rfl: 2   betamethasone dipropionate 0.05 % cream, Apply topically 2 (two) times daily. Apply to the outside of the ear canal twice a day for 14 days, Disp: 30 g, Rfl: 0   buPROPion (WELLBUTRIN XL) 150 MG 24 hr tablet, Take 150 mg by mouth daily., Disp: , Rfl:    Cholecalciferol (VITAMIN D3) 25 MCG (1000 UT) CAPS, Take by mouth., Disp: , Rfl:    ciprofloxacin-dexamethasone (CIPRODEX) OTIC suspension, Place 4 drops into the left ear 2 (two) times daily for 14 days., Disp: 7.5 mL, Rfl: 1   cyanocobalamin 1000 MCG tablet, Take 1,000 mcg by mouth daily., Disp: , Rfl:    ezetimibe  (ZETIA ) 10 MG tablet, Take 1 tablet (10 mg total) by mouth daily., Disp: 90 tablet, Rfl: 3   finasteride (PROSCAR) 5 MG tablet, Take by mouth., Disp: , Rfl:    FLUoxetine (PROZAC) 20 MG capsule, TAKE 1 CAPSULE BY MOUTH EVERY DAY WITH 40 MG for 90, Disp: , Rfl:    FLUoxetine (PROZAC) 40 MG capsule, Take by mouth., Disp: , Rfl:    losartan (COZAAR) 100 MG tablet, Take 100 mg by mouth daily., Disp: , Rfl:  spironolactone  (ALDACTONE ) 25 MG tablet, Take 1 tablet (25 mg total) by mouth daily., Disp: 90 tablet, Rfl: 3   Physical Exam:   BP 108/66 (BP Location: Right Arm, Patient Position: Sitting, Cuff Size: Normal)   Pulse 86   Ht 5' 9 (1.753 m)   Wt 197 lb (89.4 kg)   SpO2 92%   BMI 29.09 kg/m   Salient findings:  CN II-XII intact Given history and complaints, ear microscopy was indicated and performed for evaluation with findings as below in physical exam section and in procedures; left ceruminous debris and blood clot impacted onto tympanic membrane; after clearance,  Bilateral EAC clear and TM intact with well pneumatized middle ear spaces Modest eczematoid change left EAC Weber 512: mid Rinne 512: AC > BC b/l  Anterior rhinoscopy: Septum intact; bilateral inferior turbinates without significant hypertrophy No lesions of oral cavity/oropharynx No obviously palpable neck masses/lymphadenopathy/thyromegaly No respiratory distress or stridor  Seprately Identifiable Procedures:  Prior to initiating any procedures, risks/benefits/alternatives were explained to the patient and verbal consent obtained. Procedure: Bilateral ear microscopy and cerumen removal using microscope (CPT [phone]) - Mod 25 Pre-procedure diagnosis: Cerumen and blood impaction left external ears Post-procedure diagnosis: same Indication: Left cerumen impaction; given patient's otologic complaints and history as well as for improved and comprehensive examination of external ear and tympanic membrane, bilateral otologic examination using microscope was performed and impacted cerumen removed  Procedure: Patient was placed semi-recumbent. Both ear canals were examined using the microscope with findings above. Impacted Cerumen and debris removed on left with currette with improvement in EAC examination and patency. Patient tolerated the procedure well.      Impression & Plans:  Gregory Perez is a 83 y.o. male with:  1. 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 Impacted cerumen of left ear    Most likely eczematoid OE contributed to itching, which in turn led to q-tip use and then with bleeding with subsequent impaction. Cleared today. Overall, ear exam reassuring after  Will treat for eczematoid OE with ciprodex BID and betamethasone cream BID x14d F/u in 6 weeks; avoid qtips or manipulating ear; keep HA out of left ear for 2 weeks D/w pt re: HT but he declined prior  See below regarding exact medications prescribed this encounter including dosages and route: Meds ordered  this encounter  Medications   ciprofloxacin-dexamethasone (CIPRODEX) OTIC suspension    Sig: Place 4 drops into the left ear 2 (two) times daily for 14 days.    Dispense:  7.5 mL    Refill:  1   betamethasone dipropionate 0.05 % cream    Sig: Apply topically 2 (two) times daily. Apply to the outside of the ear canal twice a day for 14 days    Dispense:  30 g    Refill:  0      Thank you for allowing me the opportunity to care for your patient. Please do not hesitate to contact me should you have any other questions.  Sincerely, Eldora Blanch, MD Otolaryngologist (ENT), [facility] Phone: [phone] Fax: [phone]  01/23/2024, 3:10 PM   MDM:  Level 4 - 99204 Complexity/Problems addressed: low Data complexity: mod - independent interpretation of CT - Morbidity: mod  - Prescription Drug prescribed or managed: y

## 2024-01-23 NOTE — Patient Instructions (Signed)
 Use Ciprodex 4 drops twice daily for 14 days in left ear.  Use betamethasone cream (pea sized amount) just to outside of ear canal twice daily for 4 days in left ear.

## 2024-01-27 ENCOUNTER — Other Ambulatory Visit: Payer: Self-pay | Admitting: Cardiology

## 2024-01-27 NOTE — Telephone Encounter (Signed)
 fyi

## 2024-01-28 NOTE — Telephone Encounter (Signed)
 Hey, do y'all mind looking at this to see if there is anything abnormal?

## 2024-01-29 ENCOUNTER — Other Ambulatory Visit: Payer: Self-pay | Admitting: Cardiology

## 2024-02-02 ENCOUNTER — Ambulatory Visit (INDEPENDENT_AMBULATORY_CARE_PROVIDER_SITE_OTHER)

## 2024-02-02 DIAGNOSIS — R55 Syncope and collapse: Secondary | ICD-10-CM | POA: Diagnosis not present

## 2024-02-03 LAB — CUP PACEART REMOTE DEVICE CHECK
Date Time Interrogation Session: 20250729101447
Implantable Pulse Generator Implant Date: 20240515
Pulse Gen Model: 436066
Pulse Gen Serial Number: 94084164

## 2024-02-08 ENCOUNTER — Ambulatory Visit: Payer: Self-pay | Admitting: Cardiology

## 2024-02-09 ENCOUNTER — Encounter

## 2024-02-26 MED ORDER — AMLODIPINE BESYLATE 5 MG PO TABS: 5.0000 mg | ORAL_TABLET | Freq: Every day | ORAL | 3 refills | Status: AC

## 2024-02-26 NOTE — Telephone Encounter (Signed)
 Okay to take up to 1000-1500 mg Tylenol a day, lower the better. If blood pressure is lying low, along with lightheadedness symptoms, in addition to increasing fluid intake, we should consider lowering dose of some of the blood pressure medications.  I would recommend reducing amlodipine  from 10 mg daily to 5 mg daily.  You may cut 10 mg pill in half, or we can send a new prescription of 5 mg daily.  Hope that helps.  Thanks MJP

## 2024-03-04 ENCOUNTER — Ambulatory Visit

## 2024-03-04 DIAGNOSIS — R55 Syncope and collapse: Secondary | ICD-10-CM

## 2024-03-05 LAB — CUP PACEART REMOTE DEVICE CHECK
Date Time Interrogation Session: 20250828140444
Implantable Pulse Generator Implant Date: 20240515
Pulse Gen Model: 436066
Pulse Gen Serial Number: 94084164

## 2024-03-07 ENCOUNTER — Ambulatory Visit: Payer: Self-pay | Admitting: Cardiology

## 2024-03-11 ENCOUNTER — Encounter

## 2024-03-11 ENCOUNTER — Encounter (INDEPENDENT_AMBULATORY_CARE_PROVIDER_SITE_OTHER): Payer: Self-pay | Admitting: Otolaryngology

## 2024-03-11 ENCOUNTER — Ambulatory Visit (INDEPENDENT_AMBULATORY_CARE_PROVIDER_SITE_OTHER): Admitting: Otolaryngology

## 2024-03-11 VITALS — BP 101/64 | HR 81 | Ht 69.0 in | Wt 196.0 lb

## 2024-03-11 DIAGNOSIS — H608X2 Other otitis externa, left ear: Secondary | ICD-10-CM | POA: Diagnosis not present

## 2024-03-11 MED ORDER — FLUOCINOLONE ACETONIDE 0.01 % OT OIL: 4.0000 [drp] | TOPICAL_OIL | Freq: Two times a day (BID) | OTIC | 2 refills | Status: AC

## 2024-03-11 NOTE — Progress Notes (Signed)
 Dear Dr. Valentin, Here is my assessment for our mutual patient, Gregory Perez. Thank you for allowing me the opportunity to care for your patient. Please do not hesitate to contact me should you have any other questions. Sincerely, Dr. Eldora Blanch  Otolaryngology Clinic Note Referring provider: Dr. Valentin HPI:  Gregory Perez is a 83 y.o. male kindly referred by Dr. 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 He reports that he has recently been having some itching in the left ear, for which he used qtips and then started to have some bleeding from the left ear canal. Itching persists, but no pain. He tried debrox and flushing the ear but no improvement. Hearing has not changed. No h/o eczema.  Patient denies: ear pain, fullness, frank vertigo, drainage Patient also denies barotrauma, vestibular suppressant use, ototoxic medication use Prior ear surgery: denies  --------------------------------------------------------- 03/11/2024 Seen in follow up. He reports that his hearing has improved after his ears were cleaned. Itching is also much better. No qtip use inside the ear. He is happy with how he is doing overall.  Personal or FHx of bleeding dz or anesthesia difficulty: no  Tobacco: former, quit  PMHx: Syncope, PSVT, HLD, OSA, MDD, HTN  Independent Review of Additional Tests or Records:  Massie Valentin Referral notes reviewed and uploaded or available in chart in media tab (01/13/2024): dark blood in left ear canal, rec no q-tips; then debrox, then flushing; if no improvement, ref to ENT CTA 12/19/2022 independently interpreted with respect to ears: mastoids and ME well aerated, no otic capsule or ossicular chain abnormality noted, No large EAC lesions appreciated PMH/Meds/All/SocHx/FamHx/ROS:   Past Medical History:  Diagnosis Date   HLD (hyperlipidemia)     Hypertension    Hypokalemia    Loop Recorder Biomonitor III 11/20/2022 11/21/2022   Major depression      History reviewed. No pertinent surgical history.  History reviewed. No pertinent family history.   Social Connections: Unknown (10/18/2022)   Received from [facility]   Social Network    Social Network: Not on file      Current Outpatient Medications:    amLODipine  (NORVASC ) 5 MG tablet, Take 1 tablet (5 mg total) by mouth daily., Disp: 90 tablet, Rfl: 3   aspirin EC 81 MG tablet, Take 81 mg by mouth daily. Swallow whole., Disp: , Rfl:    atorvastatin  (LIPITOR) 80 MG tablet, TAKE 1 TABLET BY MOUTH EVERY DAY, Disp: 90 tablet, Rfl: 1   betamethasone  dipropionate 0.05 % cream, Apply topically 2 (two) times daily. Apply to the outside of the ear canal twice a day for 14 days, Disp: 30 g, Rfl: 0   buPROPion (WELLBUTRIN XL) 150 MG 24 hr tablet, Take 150 mg by mouth daily., Disp: , Rfl:    Cholecalciferol (VITAMIN D3) 25 MCG (1000 UT) CAPS, Take by mouth., Disp: , Rfl:    cyanocobalamin 1000 MCG tablet, Take 1,000 mcg by mouth daily., Disp: , Rfl:    ezetimibe  (ZETIA ) 10 MG tablet, Take 1 tablet (10 mg total) by mouth daily., Disp: 90 tablet, Rfl: 3   finasteride (PROSCAR) 5 MG tablet, Take by mouth., Disp: , Rfl:    Fluocinolone  Acetonide (DERMOTIC ) 0.01 % OIL, Place 4 drops in ear(s) in the morning and at bedtime for 14 days., Disp: 20 mL, Rfl: 2   FLUoxetine (PROZAC) 20 MG capsule, TAKE  1 CAPSULE BY MOUTH EVERY DAY WITH 40 MG for 90, Disp: , Rfl:    FLUoxetine (PROZAC) 40 MG capsule, Take by mouth., Disp: , Rfl:    losartan (COZAAR) 100 MG tablet, Take 100 mg by mouth daily., Disp: , Rfl:    spironolactone  (ALDACTONE ) 25 MG tablet, Take 1 tablet (25 mg total) by mouth daily., Disp: 90 tablet, Rfl: 1   Physical Exam:   BP 101/64 (BP Location: Right Arm, Patient Position: Sitting, Cuff Size: Large)   Pulse 81   Ht 5' 9 (1.753 m)   Wt 196 lb (88.9 kg)   SpO2 93%   BMI 28.94  kg/m   Salient findings:  CN II-XII intact Given history and complaints, ear microscopy was indicated and performed for evaluation with findings as below in physical exam section and in procedures; Bilateral EAC clear and TM intact with well pneumatized middle ear spaces Modest eczematoid change left EAC - improved No respiratory distress or stridor  Seprately Identifiable Procedures:  Prior to initiating any procedures, risks/benefits/alternatives were explained to the patient and verbal consent obtained. Procedure: Bilateral ear microscopy using microscope (CPT [phone]) Pre-procedure diagnosis: bilateral eczematoid otitis externa Post-procedure diagnosis: same Indication: see above; given patient's otologic complaints and history, for improved and comprehensive examination of external ear and tympanic membrane, bilateral otologic examination using microscope was performed. Prior to proceeding, verbal consent was obtained after discussion of R/B/A  Procedure: Patient was placed semi-recumbent. Both ear canals were examined using the microscope with findings above. Patient tolerated the procedure well.      Impression & Plans:  Gearald Perez is a 83 y.o. male with:  1. 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 He is doing much better.   If problem recurs recommend ciprodex  BID and betamethasone  cream BID x7d. Can do dermotic  once weekly for ceruminosis D/w pt re: HT but he declined prior F/u PRN  See below regarding exact medications prescribed this encounter including dosages and route: Meds ordered this encounter  Medications   Fluocinolone  Acetonide (DERMOTIC ) 0.01 % OIL    Sig: Place 4 drops in ear(s) in the morning and at bedtime for 14 days.    Dispense:  20 mL    Refill:  2      Thank you for allowing me the opportunity to care for  your patient. Please do not hesitate to contact me should you have any other questions.  Sincerely, Eldora Blanch, MD Otolaryngologist (ENT), [facility] Phone: [phone] Fax: [phone]  03/11/2024, 12:25 PM   MDM:  [phone] Complexity/Problems addressed: low Data complexity: low - Morbidity: mod  - Prescription Drug prescribed or managed: y

## 2024-03-11 NOTE — Progress Notes (Signed)
 Carelink Summary Report / Loop Recorder

## 2024-03-12 NOTE — Progress Notes (Signed)
 Remote Loop Recorder Transmission

## 2024-04-01 ENCOUNTER — Ambulatory Visit: Payer: Self-pay

## 2024-04-01 LAB — LIPID PANEL
Chol/HDL Ratio: 3.1 ratio (ref 0.0–5.0)
Cholesterol, Total: 114 mg/dL (ref 100–199)
HDL: 37 mg/dL — ABNORMAL LOW (ref >39)
LDL Chol Calc (NIH): 57 mg/dL (ref 0–99)
Triglycerides: 109 mg/dL (ref 0–149)
VLDL Cholesterol Cal: 20 mg/dL (ref 5–40)

## 2024-04-05 ENCOUNTER — Ambulatory Visit

## 2024-04-05 DIAGNOSIS — R55 Syncope and collapse: Secondary | ICD-10-CM | POA: Diagnosis not present

## 2024-04-07 LAB — CUP PACEART REMOTE DEVICE CHECK
Date Time Interrogation Session: 20251001092652
Implantable Pulse Generator Implant Date: 20240515
Pulse Gen Model: 436066
Pulse Gen Serial Number: 94084164

## 2024-04-08 ENCOUNTER — Ambulatory Visit: Payer: Self-pay | Admitting: Cardiology

## 2024-04-08 NOTE — Progress Notes (Signed)
 Remote Loop Recorder Transmission

## 2024-04-12 ENCOUNTER — Encounter

## 2024-04-12 NOTE — Progress Notes (Signed)
 Remote Loop Recorder Transmission

## 2024-04-13 ENCOUNTER — Telehealth (INDEPENDENT_AMBULATORY_CARE_PROVIDER_SITE_OTHER): Payer: Self-pay

## 2024-04-13 DIAGNOSIS — H608X2 Other otitis externa, left ear: Secondary | ICD-10-CM

## 2024-04-13 DIAGNOSIS — H9190 Unspecified hearing loss, unspecified ear: Secondary | ICD-10-CM

## 2024-04-14 NOTE — Telephone Encounter (Signed)
 Audio referral Gregory Perez

## 2024-04-19 ENCOUNTER — Other Ambulatory Visit: Payer: Self-pay | Admitting: Cardiology

## 2024-05-06 ENCOUNTER — Ambulatory Visit

## 2024-05-06 DIAGNOSIS — R55 Syncope and collapse: Secondary | ICD-10-CM | POA: Diagnosis not present

## 2024-05-07 ENCOUNTER — Emergency Department (HOSPITAL_BASED_OUTPATIENT_CLINIC_OR_DEPARTMENT_OTHER)

## 2024-05-07 ENCOUNTER — Emergency Department (HOSPITAL_BASED_OUTPATIENT_CLINIC_OR_DEPARTMENT_OTHER)
Admission: EM | Admit: 2024-05-07 | Discharge: 2024-05-07 | Disposition: A | Discharge status: Home / Self Care | Attending: Emergency Medicine | Admitting: Emergency Medicine

## 2024-05-07 ENCOUNTER — Encounter (HOSPITAL_BASED_OUTPATIENT_CLINIC_OR_DEPARTMENT_OTHER): Payer: Self-pay | Admitting: Emergency Medicine

## 2024-05-07 ENCOUNTER — Other Ambulatory Visit: Payer: Self-pay

## 2024-05-07 ENCOUNTER — Encounter: Payer: Self-pay | Admitting: Cardiology

## 2024-05-07 DIAGNOSIS — R002 Palpitations: Secondary | ICD-10-CM | POA: Diagnosis present

## 2024-05-07 DIAGNOSIS — Z79899 Other long term (current) drug therapy: Secondary | ICD-10-CM | POA: Diagnosis not present

## 2024-05-07 DIAGNOSIS — I10 Essential (primary) hypertension: Secondary | ICD-10-CM | POA: Diagnosis not present

## 2024-05-07 DIAGNOSIS — Z7982 Long term (current) use of aspirin: Secondary | ICD-10-CM | POA: Insufficient documentation

## 2024-05-07 LAB — BASIC METABOLIC PANEL WITH GFR
Anion gap: 11 (ref 5–15)
BUN: 27 mg/dL — ABNORMAL HIGH (ref 8–23)
CO2: 22 mmol/L (ref 22–32)
Calcium: 10.2 mg/dL (ref 8.9–10.3)
Chloride: 107 mmol/L (ref 98–111)
Creatinine, Ser: 1.36 mg/dL — ABNORMAL HIGH (ref 0.61–1.24)
GFR, Estimated: 52 mL/min — ABNORMAL LOW (ref >60)
Glucose, Bld: 111 mg/dL — ABNORMAL HIGH (ref 70–99)
Potassium: 4.6 mmol/L (ref 3.5–5.1)
Sodium: 141 mmol/L (ref 135–145)

## 2024-05-07 LAB — CBC
HCT: 47 % (ref 39.0–52.0)
Hemoglobin: 15.2 g/dL (ref 13.0–17.0)
MCH: 30.1 pg (ref 26.0–34.0)
MCHC: 32.3 g/dL (ref 30.0–36.0)
MCV: 93.1 fL (ref 80.0–100.0)
Platelets: 192 K/uL (ref 150–400)
RBC: 5.05 MIL/uL (ref 4.22–5.81)
RDW: 13.2 % (ref 11.5–15.5)
WBC: 8.9 K/uL (ref 4.0–10.5)
nRBC: 0 % (ref 0.0–0.2)

## 2024-05-07 LAB — TROPONIN T, HIGH SENSITIVITY
Troponin T High Sensitivity: 20 ng/L — ABNORMAL HIGH (ref 0–19)
Troponin T High Sensitivity: 23 ng/L — ABNORMAL HIGH (ref 0–19)

## 2024-05-07 NOTE — ED Provider Notes (Signed)
 [place] EMERGENCY DEPARTMENT AT [facility] Provider Note   CSN: [id number] Arrival date & time: 05/07/24  1618     Patient presents with: Palpitations   Gregory Perez is a 83 y.o. male.   Patient with palpitations at home.  They are concerned that maybe was atrial for because it was irregular.  Lasted about 42 minutes.  Onset was between 3 and 4.  No shortness of breath no chest pain.  Patient has a loop recorder but due to the download it later tonight for readings tomorrow.  Patient had a syncopal episode several months ago the loop recorder has not shown any abnormality so far.  Loop recorder was put in May 2024.  Hypertension hyperlipidemia history of depression former smoker quit 1980.  Patient's cardiac monitoring here has been sinus rhythm.  Patient's been asymptomatic.       Prior to Admission medications   Medication Sig Start Date End Date Taking? Authorizing Provider  amLODipine  (NORVASC ) 5 MG tablet Take 1 tablet (5 mg total) by mouth daily. 02/26/24   Patwardhan, Newman PARAS, MD  aspirin EC 81 MG tablet Take 81 mg by mouth daily. Swallow whole.    [provider]  atorvastatin  (LIPITOR) 80 MG tablet TAKE 1 TABLET BY MOUTH EVERY DAY 04/20/24   Patwardhan, Manish J, MD  betamethasone  dipropionate 0.05 % cream Apply topically 2 (two) times daily. Apply to the outside of the ear canal twice a day for 14 days 01/23/24   Patel, Kunjan B, MD  buPROPion (WELLBUTRIN XL) 150 MG 24 hr tablet Take 150 mg by mouth daily.    [provider]  Cholecalciferol (VITAMIN D3) 25 MCG (1000 UT) CAPS Take by mouth.    [provider]  cyanocobalamin 1000 MCG tablet Take 1,000 mcg by mouth daily.    [provider]  ezetimibe  (ZETIA ) 10 MG tablet Take 1 tablet (10 mg total) by mouth daily. 12/22/23   Patwardhan, Newman PARAS, MD  finasteride (PROSCAR) 5 MG tablet Take by mouth. 05/09/17   [provider]  FLUoxetine (PROZAC) 20 MG capsule TAKE 1  CAPSULE BY MOUTH EVERY DAY WITH 40 MG for 90    [provider]  FLUoxetine (PROZAC) 40 MG capsule Take by mouth. 09/10/21   [provider]  losartan (COZAAR) 100 MG tablet Take 100 mg by mouth daily. 08/24/19   [provider]  spironolactone  (ALDACTONE ) 25 MG tablet Take 1 tablet (25 mg total) by mouth daily. 01/29/24   Patwardhan, Newman PARAS, MD    Allergies: No known allergies    Review of Systems  Constitutional:  Negative for chills and fever.  HENT:  Negative for ear pain and sore throat.   Eyes:  Negative for pain and visual disturbance.  Respiratory:  Negative for cough and shortness of breath.   Cardiovascular:  Positive for palpitations. Negative for chest pain.  Gastrointestinal:  Negative for abdominal pain and vomiting.  Genitourinary:  Negative for dysuria and hematuria.  Musculoskeletal:  Negative for arthralgias and back pain.  Skin:  Negative for color change and rash.  Neurological:  Negative for seizures and syncope.  All other systems reviewed and are negative.   Updated Vital Signs BP 119/70   Pulse (!) 54   Temp 98.1 F (36.7 C) (Oral)   Resp (!) 21   Ht 1.753 m (5' 9)   Wt 88.5 kg   SpO2 95%   BMI 28.80 kg/m   Physical Exam Vitals and nursing note  reviewed.  Constitutional:      General: He is not in acute distress.    Appearance: Normal appearance. He is well-developed.  HENT:     Head: Normocephalic and atraumatic.  Eyes:     Conjunctiva/sclera: Conjunctivae normal.  Cardiovascular:     Rate and Rhythm: Normal rate and regular rhythm.     Heart sounds: No murmur heard. Pulmonary:     Effort: Pulmonary effort is normal. No respiratory distress.     Breath sounds: Normal breath sounds. No wheezing, rhonchi or rales.  Abdominal:     Palpations: Abdomen is soft.     Tenderness: There is no abdominal tenderness. There is no guarding.  Musculoskeletal:        General: No swelling.     Cervical back: Neck supple.   Skin:    General: Skin is warm and dry.     Capillary Refill: Capillary refill takes less than 2 seconds.  Neurological:     General: No focal deficit present.     Mental Status: He is alert and oriented to person, place, and time.  Psychiatric:        Mood and Affect: Mood normal.     (all labs ordered are listed, but only abnormal results are displayed) Labs Reviewed  BASIC METABOLIC PANEL WITH GFR - Abnormal; Notable for the following components:      Result Value   Glucose, Bld 111 (*)    BUN 27 (*)    Creatinine, Ser 1.36 (*)    GFR, Estimated 52 (*)    All other components within normal limits  TROPONIN T, HIGH SENSITIVITY - Abnormal; Notable for the following components:   Troponin T High Sensitivity 20 (*)    All other components within normal limits  TROPONIN T, HIGH SENSITIVITY - Abnormal; Notable for the following components:   Troponin T High Sensitivity 23 (*)    All other components within normal limits  CBC    EKG: EKG Interpretation Date/Time:  Friday May 07 2024 16:25:34 EDT Ventricular Rate:  81 PR Interval:  200 QRS Duration:  96 QT Interval:  366 QTC Calculation: 425 R Axis:   -8  Text Interpretation: Normal sinus rhythm Inferior infarct (cited on or before 07-May-2024) Anterior infarct (cited on or before 07-May-2024) Abnormal ECG When compared with ECG of 16-Jun-2023 13:08, Non-specific change in ST segment in Lateral leads Confirmed by Kohen Reither [phone]) on 05/07/2024 4:32:29 PM  Radiology: [facility] Chest Port 1 View Result Date: 05/07/2024 EXAM: 1 VIEW XRAY OF THE CHEST 05/07/2024 07:53:00 PM COMPARISON: None available. CLINICAL HISTORY: Palpitations. FINDINGS: LINES, TUBES AND DEVICES: There is a tangle of overlying monitor wires. A loop recorder device superimposes in the upper left thorax. LUNGS AND PLEURA: No focal pulmonary opacity. No pulmonary edema. No pleural effusion. No pneumothorax. HEART AND MEDIASTINUM: There is mild  cardiomegaly, but no evidence of CHF. There is tortuosity and patchy calcification of the aorta, with otherwise unremarkable mediastinum. BONES AND SOFT TISSUES: Osteopenia and thoracic spondylosis. IMPRESSION: 1. Mild cardiomegaly without evidence of congestive heart failure or other acute chest process . 2. Aortic tortuosity with patchy calcification. Electronically signed by: Francis Quam MD 05/07/2024 08:04 PM EDT RP Workstation: [id number]     Procedures   Medications Ordered in the ED - No data to display  Medical Decision Making Amount and/or Complexity of Data Reviewed Labs: ordered.   Cardiac monitor here has been sinus rhythm.  Her sinus bradycardia.  Basic metabolic panel creatinine up a little bit at 1.36 compared to a year ago GFR 52 electrolytes normal.  CBC white count normal at 8.9 hemoglobin 15.2 platelets 192.  Initial troponin was 20.  Delta troponin is required.  Also we need a chest x-ray.  Chest x-ray without any acute findings.  Delta troponin was 23 so no significant change.  Cardiac monitoring here without arrhythmia.  Will give patient precautions what to return for.  Will have him contact cardiology on Monday.  Final diagnoses:  Palpitation    ED Discharge Orders     None          Geraldene Hamilton, MD 05/07/24 2145

## 2024-05-07 NOTE — ED Notes (Signed)
 ED Provider at bedside.

## 2024-05-07 NOTE — ED Triage Notes (Signed)
 Pt was having palpitations today at home per son. Denies SOB or chest pain Hx loop monitor, possible afib

## 2024-05-07 NOTE — ED Notes (Signed)
 Reviewed discharge instructions and follow-up care with pt. Pt verbalized understanding and had no further questions. Pt exited ED without complications.

## 2024-05-07 NOTE — Discharge Instructions (Addendum)
 Call cardiology on Monday.  Return for any palpitations lasting 40 minutes or longer for any new or worse symptoms.  Today's evaluation troponins were normal chest x-ray was normal and cardiac monitoring without any arrhythmia.

## 2024-05-10 ENCOUNTER — Telehealth: Payer: Self-pay

## 2024-05-10 ENCOUNTER — Encounter: Payer: Self-pay | Admitting: Radiology

## 2024-05-10 LAB — CUP PACEART REMOTE DEVICE CHECK
Date Time Interrogation Session: 20251030082413
Implantable Pulse Generator Implant Date: 20240515
Pulse Gen Model: 436066
Pulse Gen Serial Number: 94084164

## 2024-05-10 NOTE — Telephone Encounter (Signed)
 Biotronik alert: AF detection ILR implanted for syncope/collapse  AF detection 05/07/24, 3:58pm-5:05pm, event duration: 1 hour, . No prior hx, no OAC.  Patient became symptomatic with palpitations enough to go to the ER. Was in NSR by the time he got to the ER on 05/07/24.    EGM review/dot graph appear consistent with AF v/s frequent PAC's?, hard to discern P waves consistently, irregular R-R with ectopy.     Prior false AF event; however, this appears different.  Forwarding to in office provider for review and confirmation.  Dr. Kennyth is patient's PCP in hospital today.    Will follow up with patient after rhythm confirmation.         INCREASE GAIN:

## 2024-05-10 NOTE — Telephone Encounter (Signed)
 Patient needs to be seen by AF clinic.  LM for patient to call back and discuss.

## 2024-05-11 NOTE — Telephone Encounter (Signed)
 Reviewed.  Also noted the patient was in the ER on 05/07/2024.  Recommend anticoagulation for A-fib given high CHA2DS2VAsc score and stroke risk.  Recommend Eliquis 5 mg twice daily.  However, if patient was told of initiation until his appointment with me on 05/14/2024, that is okay.  Thanks MJP

## 2024-05-11 NOTE — Telephone Encounter (Signed)
 Call back received from Pt.  Advised Afib found on loop recorder.  Pt states he has follow up scheduled this Friday with Dr. Elmira.  Advised no additional follow up should be needed.  Will send to Dr. Elmira so he is aware to discuss at follow up.

## 2024-05-12 ENCOUNTER — Ambulatory Visit: Payer: Self-pay | Admitting: Cardiology

## 2024-05-12 NOTE — Progress Notes (Signed)
 Remote Loop Recorder Transmission

## 2024-05-13 ENCOUNTER — Encounter

## 2024-05-14 ENCOUNTER — Encounter: Payer: Self-pay | Admitting: Cardiology

## 2024-05-14 ENCOUNTER — Other Ambulatory Visit (HOSPITAL_COMMUNITY): Payer: Self-pay

## 2024-05-14 ENCOUNTER — Ambulatory Visit: Attending: Cardiology | Admitting: Cardiology

## 2024-05-14 VITALS — BP 116/58 | HR 58 | Ht 69.0 in | Wt 201.0 lb

## 2024-05-14 DIAGNOSIS — I2510 Atherosclerotic heart disease of native coronary artery without angina pectoris: Secondary | ICD-10-CM | POA: Insufficient documentation

## 2024-05-14 DIAGNOSIS — I480 Paroxysmal atrial fibrillation: Secondary | ICD-10-CM | POA: Insufficient documentation

## 2024-05-14 DIAGNOSIS — E782 Mixed hyperlipidemia: Secondary | ICD-10-CM | POA: Insufficient documentation

## 2024-05-14 DIAGNOSIS — I10 Essential (primary) hypertension: Secondary | ICD-10-CM | POA: Diagnosis present

## 2024-05-14 MED ORDER — APIXABAN 5 MG PO TABS
5.0000 mg | ORAL_TABLET | Freq: Two times a day (BID) | ORAL | 3 refills | Status: AC
  Filled 2024-05-14: qty 60, 30d supply, fill #0

## 2024-05-14 MED ORDER — METOPROLOL TARTRATE 25 MG PO TABS
25.0000 mg | ORAL_TABLET | Freq: Every day | ORAL | 3 refills | Status: AC | PRN
  Filled 2024-05-14: qty 180, 90d supply, fill #0

## 2024-05-14 NOTE — Patient Instructions (Signed)
 Medication Instructions:  Your physician has recommended you make the following change in your medication:  STOP: Aspirin STOP: Spirolactone (Aldactone )  START: Eliquis 5 mg twice daily  START: Metoprolol  tartrate 25-50 mg (1 -2 tablets) as needed for heart rate greater then 130 bpm  for 2 hours.   *If you need a refill on your cardiac medications before your next appointment, please call your pharmacy*   Follow-Up: At [facility], you and your health needs are our priority.  As part of our continuing mission to provide you with exceptional heart care, our providers are all part of one team.  This team includes your primary Cardiologist (physician) and Advanced Practice Providers or APPs (Physician Assistants and Nurse Practitioners) who all work together to provide you with the care you need, when you need it.  Your next appointment:   6 month(s)  Provider:   Newman JINNY Lawrence, MD

## 2024-05-14 NOTE — Progress Notes (Signed)
 Cardiology Office Note:  .   Date:  05/14/2024  ID:  Gregory Perez, DOB [birth year], MRN [id number] PCP: Valentin Skates, DO  [facility] Providers Cardiologist:  Newman Lawrence, MD PCP: Valentin Skates, DO  Chief Complaint  Patient presents with   Coronary Artery Disease      History of Present Illness: .    Gregory Perez is a 83 y.o. male with hypertension, hyperlipidemia, elevated coronary calcium  score, PSVT, h/o syncope   Patient was recently seen in the emergency room on 05/07/2024 with complaints of palpitations.  Workup was significant for mildly elevated creatinine and mild elevated but flat troponin.  Subsequently, A-fib was detected for 1 hour 11-minute on his loop recorder.  The time stamp on his A-fib detection and further factors with patient's symptoms.  He has not had any recurrent symptoms of palpitations since then.  Vitals:   05/14/24 0818  BP: (!) 116/58  Pulse: (!) 58  SpO2: 96%      ROS:  Review of Systems  Cardiovascular:  Positive for palpitations. Negative for chest pain, dyspnea on exertion, leg swelling and syncope.     Studies Reviewed: SABRA        EKG 05/14/2024: Sinus bradycardia Possible Anterior infarct (cited on or before 07-May-2024) When compared with ECG of 07-May-2024 16:25, Criteria for Inferior infarct are no longer Present      Biotronik alert: AF detection ILR implanted for syncope/collapse AF detection 05/07/24, 3:58pm-5:05pm, event duration: 1 hour, . No prior hx, no OAC.   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  No acute abnormalities are noted.  No abnormal enhancement is noted.   Echocardiogram 11/18/2022: Left ventricle cavity is normal in size and wall thickness. Normal global wall motion. Normal LV systolic function  with EF 59%. Indeterminate diastolic filling pattern.  Trileaflet aortic valve with aortic sclerosis. No significant stenosis or regurgitation. No evidence of pulmonary hypertension.   CTA cor 11/12/2022: 1. Total coronary calcium  score of 260. This was 35th percentile for age and sex matched control. 2. Normal coronary origin with right dominance. 3. CAD-RADS = 3.   Left Main: Mild stenosis (25-49%) closer to 25% due to eccentric calcified plaque at distal left main. LAD: Mild stenosis (25-49%) due to soft plaque at the proximal LAD. Ramus: Moderate stenosis (50-69%) closer to 50% due to calcified plaque at proximal/mid segment otherwise vessel is patent. LCx: Patent. RCA: Moderate stenosis (50-69%) closer to 60% stenosis due to mixed plaque at distal RCA.   4. Aortic atherosclerosis (soft and calcified plaque). 5. Patent Foramen ovale. 6. The aortic valve is trileaflet with sclerosis (consider echocardiogram to evaluate hemodynamics).   4. Study is sent for CT-FFR to further evaluate ramus and RCA. Findings will be performed and reported separately.   CT FFR 11/13/2022: 1. Left Main: FFR = 1.0   2. LAD: Proximal FFR = 0.97, mid FFR = 0.93, distal FFR = 0.86 3. LCX: Proximal FFR = 0.97, distal FFR = 0.96 4. Ramus: Proximal FFR = 0.96, distal FFR = 0.92 5. RCA: Proximal FFR = 0.99, mid FFR =0.95, distal FFR = 0.95   IMPRESSION: 1.  CT FFR analysis showed no significant stenosis.   RECOMMENDATIONS: Goal directed medical therapy and aggressive risk factor modification for secondary prevention of coronary artery disease.  Labs 04/2024: Chol 114, TG 109, HDL  37, LDL 57 Hb 15.2 Cr 1.36, eGFR 52 Trop HS 20, 23    Risk Assessment/Calculations:    CHA2DS2-VASc Score = 5  This indicates a 7.2% annual risk of stroke. The patient's score is based upon: CHF History: 0 HTN History: 1 Diabetes History: 0 Stroke History: 2 Vascular Disease History: 0 Age Score: 2 Gender  Score: 0     Physical Exam:   Physical Exam Vitals and nursing note reviewed.  Constitutional:      General: He is not in acute distress. Neck:     Vascular: No JVD.  Cardiovascular:     Rate and Rhythm: Normal rate and regular rhythm.     Heart sounds: Normal heart sounds. No murmur heard. Pulmonary:     Effort: Pulmonary effort is normal.     Breath sounds: Normal breath sounds. No wheezing or rales.  Musculoskeletal:     Right lower leg: No edema.     Left lower leg: No edema.      VISIT DIAGNOSES:   ICD-10-CM   1. PAF (paroxysmal atrial fibrillation) (HCC)  I48.0     2. Mixed hyperlipidemia  E78.2 EKG 12-Lead    3. Coronary artery disease involving native coronary artery of native heart without angina pectoris  I25.10 EKG 12-Lead    4. Essential hypertension  I10 EKG 12-Lead       ASSESSMENT AND PLAN: .    Gregory Perez is a 83 y.o. male with hypertension, hyperlipidemia, elevated coronary calcium  score, PSVT, h/o syncope   Paroxysmal A-fib: 1 hour episode noted on 05/08/2023, correlated with patient's symptoms of palpitations. While this episode was brief, it is very possible that this is not the last time patient would experience A-fib. Overall, I do think anticoagulation for stroke prevention has more benefits than risks. Recommend Eliquis 5 mg twice daily. If patient symptoms are brief, recurrent, I do not think he needs any rhythm control therapy at this time. His resting heart rate is in 50s without any AV nodal blocking agents. I will prescribe him metoprolol  to tartrate 25 mg for as needed use should he have episodes of A-fib lasting for greater than 2 hours or so.  Syncope: Lone episode in spring 2024 with no recurrence. No clear etiology noted.   Elevated coronary calcium  score: Given LM calcificaiton and absence of bleeding, okay to continue Aspirin 81 mg daily. Lipids very well-controlled Lipitor 80 mg daily, continue the same.     Hypertension: Numbers low normal.  Okay to stop spironolactone . Continue losartan and amlodipine .  F/u in 6 months  Signed, Newman JINNY Lawrence, MD

## 2024-05-17 NOTE — Telephone Encounter (Signed)
 Pt was seen in office and Eliquis was started at appointment.

## 2024-05-18 ENCOUNTER — Other Ambulatory Visit (HOSPITAL_COMMUNITY): Payer: Self-pay

## 2024-05-20 ENCOUNTER — Telehealth: Payer: Self-pay | Admitting: Cardiology

## 2024-05-20 NOTE — Telephone Encounter (Signed)
  Per Mychart scheduling message:  Pt c/o medication issue:  1. Name of Medication: apixaban (ELIQUIS) 5 MG TABS tablet   2. How are you currently taking this medication (dosage and times per day)? Take 1 tablet (5 mg total) by mouth 2 (two) times daily.   3. Are you having a reaction (difficulty breathing--STAT)? NA  4. What is your medication issue?   I am now on liquid. I read it can have a negative reaction with Prozac.  Could you please get verification

## 2024-05-24 NOTE — Telephone Encounter (Signed)
 Gregory Perez, [facility]    05/21/24  3:05 PM No significant drug drug interaction noted between Prozac and Eliquis   Left message for patient to call back.

## 2024-05-26 NOTE — Telephone Encounter (Signed)
  Per MyChart scheduling message:  Erskin Iha, hope you can help, I am trying to return a call from [facility]. Leave messages and do not hear from anyone  Signe Cedar

## 2024-05-26 NOTE — Telephone Encounter (Signed)
 Spoke with patient regarding Prozac and Eliquis .  Per Patel, Vaishali K, RPH:  No significant drug drug interaction noted between Prozac and Eliquis   Pt verbalized understanding and all needs were met.

## 2024-06-07 ENCOUNTER — Ambulatory Visit

## 2024-06-07 DIAGNOSIS — I480 Paroxysmal atrial fibrillation: Secondary | ICD-10-CM | POA: Diagnosis not present

## 2024-06-07 LAB — CUP PACEART REMOTE DEVICE CHECK
Date Time Interrogation Session: 20251201075652
Implantable Pulse Generator Implant Date: 20240515
Pulse Gen Model: 436066
Pulse Gen Serial Number: 94084164

## 2024-06-10 NOTE — Progress Notes (Signed)
 Remote Loop Recorder Transmission

## 2024-06-14 ENCOUNTER — Encounter

## 2024-06-24 ENCOUNTER — Ambulatory Visit: Payer: Self-pay | Admitting: Cardiology

## 2024-07-08 ENCOUNTER — Ambulatory Visit

## 2024-07-08 DIAGNOSIS — I480 Paroxysmal atrial fibrillation: Secondary | ICD-10-CM

## 2024-07-09 LAB — CUP PACEART REMOTE DEVICE CHECK
Date Time Interrogation Session: 20260102095617
Implantable Pulse Generator Implant Date: 20240515
Pulse Gen Model: 436066
Pulse Gen Serial Number: 94084164

## 2024-07-10 ENCOUNTER — Ambulatory Visit: Payer: Self-pay | Admitting: Cardiology

## 2024-07-13 NOTE — Progress Notes (Signed)
 Remote Loop Recorder Transmission

## 2024-07-15 ENCOUNTER — Encounter

## 2024-07-23 ENCOUNTER — Other Ambulatory Visit: Payer: Self-pay | Admitting: Cardiology

## 2024-07-26 NOTE — Telephone Encounter (Signed)
 In accordance with refill protocols, please review and address the following requirements before this medication refill can be authorized:  Labs

## 2024-07-26 NOTE — Telephone Encounter (Signed)
 Labs were okay in 04/2024, okay to refill.  Thanks MJP

## 2024-08-09 ENCOUNTER — Ambulatory Visit

## 2024-08-10 LAB — CUP PACEART REMOTE DEVICE CHECK
Date Time Interrogation Session: 20260202105459
Implantable Pulse Generator Implant Date: 20240515
Pulse Gen Model: 436066
Pulse Gen Serial Number: 94084164

## 2024-08-16 ENCOUNTER — Encounter

## 2024-11-09 ENCOUNTER — Ambulatory Visit

## 2025-02-08 ENCOUNTER — Ambulatory Visit

## 2025-05-10 ENCOUNTER — Ambulatory Visit

## 2025-08-09 ENCOUNTER — Ambulatory Visit

## 2025-11-08 ENCOUNTER — Ambulatory Visit
